# Patient Record
Sex: Female | Born: 1970 | Race: White | Hispanic: No | Smoking: Never smoker
Health system: Southern US, Community
[De-identification: ages and names within clinical notes are randomized; demographics above are authoritative.]

## PROBLEM LIST (undated history)

## (undated) DIAGNOSIS — E0500 Thyrotoxicosis with diffuse goiter without thyrotoxic crisis or storm: Secondary | ICD-10-CM

## (undated) DIAGNOSIS — R87612 Low grade squamous intraepithelial lesion on cytologic smear of cervix (LGSIL): Secondary | ICD-10-CM

## (undated) DIAGNOSIS — D649 Anemia, unspecified: Secondary | ICD-10-CM

## (undated) DIAGNOSIS — IMO0002 Reserved for concepts with insufficient information to code with codable children: Secondary | ICD-10-CM

## (undated) DIAGNOSIS — F419 Anxiety disorder, unspecified: Secondary | ICD-10-CM

## (undated) DIAGNOSIS — J45909 Unspecified asthma, uncomplicated: Secondary | ICD-10-CM

## (undated) DIAGNOSIS — R87810 Cervical high risk human papillomavirus (HPV) DNA test positive: Secondary | ICD-10-CM

## (undated) DIAGNOSIS — R51 Headache: Secondary | ICD-10-CM

## (undated) DIAGNOSIS — T7840XA Allergy, unspecified, initial encounter: Secondary | ICD-10-CM

## (undated) DIAGNOSIS — N900 Mild vulvar dysplasia: Secondary | ICD-10-CM

## (undated) DIAGNOSIS — R87610 Atypical squamous cells of undetermined significance on cytologic smear of cervix (ASC-US): Secondary | ICD-10-CM

## (undated) HISTORY — PX: WISDOM TOOTH EXTRACTION: SHX21

## (undated) HISTORY — DX: Atypical squamous cells of undetermined significance on cytologic smear of cervix (ASC-US): R87.610

## (undated) HISTORY — DX: Anemia, unspecified: D64.9

## (undated) HISTORY — DX: Reserved for concepts with insufficient information to code with codable children: IMO0002

## (undated) HISTORY — DX: Anxiety disorder, unspecified: F41.9

## (undated) HISTORY — DX: Allergy, unspecified, initial encounter: T78.40XA

## (undated) HISTORY — PX: MOUTH SURGERY: SHX715

## (undated) HISTORY — DX: Low grade squamous intraepithelial lesion on cytologic smear of cervix (LGSIL): R87.612

## (undated) HISTORY — DX: Thyrotoxicosis with diffuse goiter without thyrotoxic crisis or storm: E05.00

## (undated) HISTORY — DX: Mild vulvar dysplasia: N90.0

## (undated) HISTORY — DX: Unspecified asthma, uncomplicated: J45.909

## (undated) HISTORY — DX: Cervical high risk human papillomavirus (HPV) DNA test positive: R87.810

---

## 2003-07-23 ENCOUNTER — Emergency Department (HOSPITAL_COMMUNITY): Admission: EM | Admit: 2003-07-23 | Discharge: 2003-07-23 | Payer: Self-pay | Admitting: Emergency Medicine

## 2004-07-22 ENCOUNTER — Other Ambulatory Visit: Admission: RE | Admit: 2004-07-22 | Discharge: 2004-07-22 | Payer: Self-pay | Admitting: Gynecology

## 2004-08-16 DIAGNOSIS — N900 Mild vulvar dysplasia: Secondary | ICD-10-CM

## 2004-08-16 HISTORY — DX: Mild vulvar dysplasia: N90.0

## 2004-08-21 ENCOUNTER — Inpatient Hospital Stay (HOSPITAL_COMMUNITY): Admission: AD | Admit: 2004-08-21 | Discharge: 2004-08-22 | Payer: Self-pay | Admitting: Gynecology

## 2004-12-30 ENCOUNTER — Observation Stay (HOSPITAL_COMMUNITY): Admission: AD | Admit: 2004-12-30 | Discharge: 2004-12-31 | Payer: Self-pay | Admitting: Gynecology

## 2005-02-16 ENCOUNTER — Inpatient Hospital Stay (HOSPITAL_COMMUNITY): Admission: AD | Admit: 2005-02-16 | Discharge: 2005-02-18 | Payer: Self-pay | Admitting: Gynecology

## 2005-05-07 ENCOUNTER — Other Ambulatory Visit: Admission: RE | Admit: 2005-05-07 | Discharge: 2005-05-07 | Payer: Self-pay | Admitting: Gynecology

## 2006-06-08 ENCOUNTER — Other Ambulatory Visit: Admission: RE | Admit: 2006-06-08 | Discharge: 2006-06-08 | Payer: Self-pay | Admitting: Gynecology

## 2007-07-05 ENCOUNTER — Other Ambulatory Visit: Admission: RE | Admit: 2007-07-05 | Discharge: 2007-07-05 | Payer: Self-pay | Admitting: Gynecology

## 2008-07-08 ENCOUNTER — Ambulatory Visit: Payer: Self-pay | Admitting: Gynecology

## 2008-07-08 ENCOUNTER — Other Ambulatory Visit: Admission: RE | Admit: 2008-07-08 | Discharge: 2008-07-08 | Payer: Self-pay | Admitting: Gynecology

## 2008-07-08 ENCOUNTER — Encounter: Payer: Self-pay | Admitting: Gynecology

## 2009-07-16 ENCOUNTER — Ambulatory Visit: Payer: Self-pay | Admitting: Gynecology

## 2009-07-16 ENCOUNTER — Other Ambulatory Visit: Admission: RE | Admit: 2009-07-16 | Discharge: 2009-07-16 | Payer: Self-pay | Admitting: Gynecology

## 2009-09-23 ENCOUNTER — Emergency Department (HOSPITAL_COMMUNITY): Admission: EM | Admit: 2009-09-23 | Discharge: 2009-09-23 | Payer: Self-pay | Admitting: Emergency Medicine

## 2010-09-09 ENCOUNTER — Other Ambulatory Visit: Payer: Self-pay | Admitting: Gynecology

## 2010-09-09 ENCOUNTER — Other Ambulatory Visit (HOSPITAL_COMMUNITY)
Admission: RE | Admit: 2010-09-09 | Discharge: 2010-09-09 | Disposition: A | Discharge status: Home / Self Care | Payer: Commercial Indemnity | Source: Ambulatory Visit | Attending: Gynecology | Admitting: Gynecology

## 2010-09-09 ENCOUNTER — Ambulatory Visit
Admission: RE | Admit: 2010-09-09 | Discharge: 2010-09-09 | Payer: Self-pay | Source: Home / Self Care | Attending: Gynecology | Admitting: Gynecology

## 2010-09-09 DIAGNOSIS — Z124 Encounter for screening for malignant neoplasm of cervix: Secondary | ICD-10-CM | POA: Insufficient documentation

## 2010-09-14 ENCOUNTER — Other Ambulatory Visit: Payer: Self-pay | Admitting: Gynecology

## 2010-09-14 DIAGNOSIS — Z1239 Encounter for other screening for malignant neoplasm of breast: Secondary | ICD-10-CM

## 2010-10-07 ENCOUNTER — Ambulatory Visit
Admission: RE | Admit: 2010-10-07 | Discharge: 2010-10-07 | Disposition: A | Discharge status: Home / Self Care | Payer: Commercial Indemnity | Source: Ambulatory Visit | Attending: Gynecology | Admitting: Gynecology

## 2010-10-07 DIAGNOSIS — Z1239 Encounter for other screening for malignant neoplasm of breast: Secondary | ICD-10-CM

## 2010-10-12 ENCOUNTER — Other Ambulatory Visit: Payer: Self-pay | Admitting: Family Medicine

## 2010-10-13 ENCOUNTER — Ambulatory Visit
Admission: RE | Admit: 2010-10-13 | Discharge: 2010-10-13 | Disposition: A | Discharge status: Home / Self Care | Payer: Commercial Indemnity | Source: Ambulatory Visit | Attending: Family Medicine | Admitting: Family Medicine

## 2010-11-05 LAB — CBC
MCHC: 34.8 g/dL (ref 30.0–36.0)
RBC: 3.81 MIL/uL — ABNORMAL LOW (ref 3.87–5.11)
RDW: 13.5 % (ref 11.5–15.5)

## 2010-11-05 LAB — DIFFERENTIAL
Eosinophils Absolute: 0.2 10*3/uL (ref 0.0–0.7)
Eosinophils Relative: 3 % (ref 0–5)
Lymphs Abs: 2 10*3/uL (ref 0.7–4.0)
Monocytes Relative: 9 % (ref 3–12)

## 2010-11-05 LAB — URINALYSIS, ROUTINE W REFLEX MICROSCOPIC
Bilirubin Urine: NEGATIVE
Ketones, ur: NEGATIVE mg/dL
Nitrite: NEGATIVE
Specific Gravity, Urine: 1.011 (ref 1.005–1.030)
Urobilinogen, UA: 0.2 mg/dL (ref 0.0–1.0)

## 2010-11-05 LAB — COMPREHENSIVE METABOLIC PANEL
ALT: 12 U/L (ref 0–35)
AST: 13 U/L (ref 0–37)
Albumin: 3.8 g/dL (ref 3.5–5.2)
Calcium: 9.1 mg/dL (ref 8.4–10.5)
GFR calc Af Amer: >60 mL/min (ref >60)
Sodium: 141 mEq/L (ref 135–145)
Total Protein: 6.1 g/dL (ref 6.0–8.3)

## 2011-01-01 NOTE — Op Note (Signed)
Becky Aguilar, Becky Aguilar           ACCOUNT NO.:  [id number]   MEDICAL RECORD NO.:  [id number]          PATIENT TYPE:  INP   LOCATION:  9166                          FACILITY:  [facility]   PHYSICIAN:  Charles A. Delcambre, MDDATE OF BIRTH:  [birth year]   DATE OF PROCEDURE:  02/16/2005  DATE OF DISCHARGE:                                 OPERATIVE REPORT   DELIVERY NOTE:  This patient progressed on to complete dilation and became  complete at 2008.  She had spontaneous vaginal delivery at 2225 with  delivery of placenta at 2241.  She had vigorous female with Apgars 8 and 9.  Meconium was noted.  DeLee suction was done on the perineum.  Dr. Alison Murray was  in attendance but the baby vigorously cried upon delivery of the head.  After suctioning with vigorous bulb suction was carried out and cord was cut  by the father of the baby.  Infant was handed to Dr. Alison Murray but routine care  was undertaken thereafter.  A secondary midline laceration was noted and  repaired with 2-0 Vicryl, three sutures in the anterior capsule just to  strengthen.  The capsule was not felt to be lacerated significantly for anal  sphincter but was strengthened in this manner.  2-0 Vicryl and 3-0 chromic  was used in layers to repair the remaining secondary laceration with local  anesthetic and 15 mg of fentanyl  IV.  Estimated blood loss was 300 mL.  Nuchal cord was reduced x2 at delivery.  Placenta was spontaneous and three  vessels and intact as noted above.  Cord gas was returned PH venous 7.35.  Arterial gas was drawn but was not enough according to the lab.  Mother  and baby recovered and stable at this time.       CAD/MEDQ  D:  02/17/2005  T:  02/17/2005  Job:  [id number]

## 2011-02-16 ENCOUNTER — Other Ambulatory Visit: Payer: Self-pay | Admitting: Dermatology

## 2011-07-20 ENCOUNTER — Other Ambulatory Visit: Payer: Self-pay | Admitting: Dermatology

## 2011-09-20 ENCOUNTER — Encounter: Payer: Self-pay | Admitting: *Deleted

## 2011-09-20 DIAGNOSIS — N900 Mild vulvar dysplasia: Secondary | ICD-10-CM | POA: Insufficient documentation

## 2011-09-22 ENCOUNTER — Other Ambulatory Visit (HOSPITAL_COMMUNITY)
Admission: RE | Admit: 2011-09-22 | Discharge: 2011-09-22 | Disposition: A | Discharge status: Home / Self Care | Payer: Managed Care, Other (non HMO) | Source: Ambulatory Visit | Attending: Gynecology | Admitting: Gynecology

## 2011-09-22 ENCOUNTER — Ambulatory Visit (INDEPENDENT_AMBULATORY_CARE_PROVIDER_SITE_OTHER): Payer: Commercial Indemnity | Admitting: Gynecology

## 2011-09-22 ENCOUNTER — Encounter: Payer: Self-pay | Admitting: Gynecology

## 2011-09-22 VITALS — BP 110/60 | Ht 69.0 in | Wt 139.0 lb

## 2011-09-22 DIAGNOSIS — Z13220 Encounter for screening for lipoid disorders: Secondary | ICD-10-CM

## 2011-09-22 DIAGNOSIS — N814 Uterovaginal prolapse, unspecified: Secondary | ICD-10-CM

## 2011-09-22 DIAGNOSIS — Z131 Encounter for screening for diabetes mellitus: Secondary | ICD-10-CM

## 2011-09-22 DIAGNOSIS — Z30431 Encounter for routine checking of intrauterine contraceptive device: Secondary | ICD-10-CM

## 2011-09-22 DIAGNOSIS — Z01419 Encounter for gynecological examination (general) (routine) without abnormal findings: Secondary | ICD-10-CM | POA: Insufficient documentation

## 2011-09-22 DIAGNOSIS — N816 Rectocele: Secondary | ICD-10-CM

## 2011-09-22 LAB — URINALYSIS W MICROSCOPIC + REFLEX CULTURE
Bilirubin Urine: NEGATIVE
Nitrite: NEGATIVE
Specific Gravity, Urine: 1.02 (ref 1.005–1.030)
pH: 6 (ref 5.0–8.0)

## 2011-09-22 NOTE — Patient Instructions (Signed)
Start prenatal vitamins. Wait one to 2 months before trying for pregnancy. Follow up with obstetrician once pregnancy occurs.

## 2011-09-22 NOTE — Progress Notes (Signed)
Becky Aguilar 04-Sep-1970 [id number]        41 y.o.  for annual exam.  Several issues as noted below.  Past medical history,surgical history, medications, allergies, family history and social history were all reviewed and documented in the EPIC chart. ROS:  Was performed and pertinent positives and negatives are included in the history.  Exam: [name] chaperone present Filed Vitals:   09/22/11 1021  BP: 110/60   General appearance  Normal Skin grossly normal Head/Neck normal with no cervical or supraclavicular adenopathy thyroid normal Lungs  clear Cardiac RR, without RMG Abdominal  soft, nontender, without masses, organomegaly or hernia Breasts  examined lying and sitting without masses, retractions, discharge or axillary adenopathy. Pelvic  Ext/BUS/vagina  normal   Cervix  normal  Pap done IUD string visualized  Uterus  retroverted, normal size, shape and contour, midline and mobile nontender with first degree prolapse  Adnexa  Without masses or tenderness    Anus and perineum  normal   Rectovaginal  normal sphincter tone without palpated masses or tenderness. With first degree to second degree rectocele   Assessment/Plan:  41 y.o. female for annual exam.    1. IUD management. Patient's planning pregnancy and wants her IUD removed. Her ParaGard IUD string was grasped with a [place] forceps and removed, shown to the patient and discarded. 2. Pregnancy trial. Recommended patient wait 1-2 cycles before attempting pregnancy to allow for healthy endometrial growth. Recommended prenatal vitamins preconceptionally. Patient understands that we do not do obstetrics and that when she does become pregnant she will need to establish obstetrical care with another OB group in town and she agrees with this. 3. Pelvic relaxation. Patient has complaints of pelvic heaviness and feeling like something is pushing out her vagina particularly when she's constipated. Does not happen all the time. She does  have evidence of a first-degree uterine prolapse with some total scoping of this curve cervix to within 2-3 fingerbreadths of the introitus. She also has a first to second-degree rectocele with exam. Bladder appears well supported. I reviewed the issues with this and certainly would not recommend doing anything until she is done with her pregnancy and hopefully all childbearing before doing anything. Options for pessary, surgical repair such as suspension anterior-posterior colporrhaphy up to and including hysterectomy were all reviewed. Patient will monitor present. 4. Mammography. Patient had her baseline last February Will repeat at age 36. SBE monthly reviewed. 5. Pap smear. Pap smear was done today. 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 6. Health maintenance. Baseline CBC glucose lipid profile urinalysis was ordered. Patient will follow up when she achieves pregnancy or in one year.    Dara Lords MD, 10:59 AM 09/22/2011

## 2011-09-22 NOTE — Progress Notes (Signed)
Addended by: Dayna Barker on: 09/22/2011 11:09 AM   Modules accepted: Orders

## 2011-09-27 NOTE — Progress Notes (Signed)
Attempted to draw patient twice; patient felt sick and said she would come back another day for blood work. Urine sample done only. ncs 09/27/11

## 2011-09-30 ENCOUNTER — Telehealth: Payer: Self-pay | Admitting: *Deleted

## 2011-09-30 NOTE — Telephone Encounter (Signed)
Pt is calling to follow up from office visit on 09/22/11, she is wanting to proceed with surgery that was dicussed.

## 2011-09-30 NOTE — Telephone Encounter (Signed)
Yes, pt spoke with her family about it and they decided to proceed.

## 2011-09-30 NOTE — Telephone Encounter (Signed)
Is she talking about hysterectomy with the vaginal repair ?

## 2011-09-30 NOTE — Telephone Encounter (Signed)
I spoke with the patient just to reconfirm the she's not having any urinary symptoms and she is not having any stress incontinence type symptoms. We'll plan on a TVH with posterior colporrhaphy. Patient will schedule and present for a preoperative consult.

## 2011-10-01 ENCOUNTER — Telehealth: Payer: Self-pay

## 2011-10-01 NOTE — Telephone Encounter (Signed)
Spoke with patient regarding scheduling surgery. She would like to schedule ASAP as some concerns regarding her job/ins.  I scheduled her for March 4 at 1:00pm at [facility].  We scheduled her preop consult with Dr. Velvet Bathe for 10/06/11 at 3pm as she is off work that day.  She will call if any questions otherwise will wait to hear from [facility] with all her instructions.

## 2011-10-04 ENCOUNTER — Encounter (HOSPITAL_COMMUNITY): Payer: Self-pay | Admitting: Pharmacist

## 2011-10-06 ENCOUNTER — Encounter: Payer: Self-pay | Admitting: Gynecology

## 2011-10-06 ENCOUNTER — Ambulatory Visit (INDEPENDENT_AMBULATORY_CARE_PROVIDER_SITE_OTHER): Payer: Commercial Indemnity | Admitting: Gynecology

## 2011-10-06 DIAGNOSIS — N814 Uterovaginal prolapse, unspecified: Secondary | ICD-10-CM

## 2011-10-06 DIAGNOSIS — N816 Rectocele: Secondary | ICD-10-CM

## 2011-10-06 NOTE — H&P (Signed)
Becky Aguilar 1971/03/16 [id number]   History and Physical    Chief complaint: uterine prolapse/rectocele  History of present illness: 41 y.o.  G1 P90 female with complaints of increasing pelvic heaviness feeling like something is protruding from the vagina. Exam reveals first to second-degree uterine prolapse and first to second degree rectocele she is to be admitted for planned vaginal hysterectomy, posterior colporrhaphy.   Past medical history,surgical history, medications, allergies, family history and social history were all reviewed and documented in the EPIC chart. ROS:  Was performed and pertinent positives and negatives are included in the history of present illness.  Exam: General: well developed, well nourished female, no acute distress HEENT: normal  Lungs: clear to auscultation without wheezing, rales or rhonchi  Cardiac: regular rate without rubs, murmurs or gallops  Abdomen: soft, nontender without masses, guarding, rebound, organomegaly  Pelvic: external bus vagina: normal   Cervix: grossly normal  Uterus: normal size, midline and mobile, nontender descends to within 1-2 fingers  from the introitus. Adnexa: without masses or tenderness  Rectovaginal exam within first to second degree rectocele     Assessment/Plan:  41 year old G1 P72 female with symptomatic uterine prolapse and rectocele.  Options for management have been reviewed with her to include expected management, pessary use as well as hysterectomy with posterior repair. Patient has decided to proceed with hysterectomy and posterior repair. She has good anterior support and no history of urinary incontinence. I reviewed with her the anatomy and issues with prolapse she understands she is at increased risk for cystocele and vaginal prolapse in the future and possible need for future surgery and she accepts this. The absolute and irreversible sterility of hysterectomy was reviewed with her. Particularly since the  previous outpatient visit was for removal of her IUD for consideration for future pregnancy. I reviewed this with her and she stated that she went home and thought seriously about whether she wants to proceed with pregnancy or not and has decided against any future pregnancies and wants to proceed with hysterectomy clearly understanding it is absolute irreversible sterility. Sexuality following hysterectomy was also reviewed and the possibility of persistent orgasmic dysfunction or persistent dyspareunia was reviewed with her. The issues of a posterior repair and vaginal tightening was reviewed and she understands that the vagina may require some dilatation following the surgery and accepts this possibility. The ovarian conservation issue was reviewed with her and the options of keeping both ovaries or removing both ovaries was discussed. The issues of continued hormone production versus removal of estrogen production and the risks including symptoms, accelerated cardiovascular risk accelerated osteoporosis were reviewed and the patient wants to keep both ovaries if possible. By doing so she understands there is risk of ovarian disease and ovarian cancer in the future and accepts this possibility. She also gives me permission though to remove one or both ovaries if it is my best judgment to do so during the procedure.  The expected intraoperative postoperative courses were reviewed with her and the risks of infection requiring prolonged antibiotics, abscess hematoma formation requiring reoperation and drainage was reviewed with her. She understands that we are attempting a vaginal approach but at any time during the surgery if I feels unsafe to proceed or complications arise we will convert this to an abdominal approach which would mean an abdominal incision and longer recovery period. Incisional complications to include opening and draining of incisions, closure by secondary intention, cosmetic such as keloid  formation and long-term issues with hernia formation  were reviewed. The risk of hemorrhage necessitating transfusion and the risks of transfusion to include transfusion reaction, hepatitis, HIV, mad cow disease and other unknown entities was reviewed with her. The risk of inadvertent injury to internal organs including bowel, bladder, ureters, vessels and nerves, either immediately recognized or delay recognized, necessitating major exploratory reparative surgeries and future reparative surgeries including bowel resection, bladder repair, ureteral damage repair and ostomy formation was all discussed understood and accepted. The patient's questions were answered to her satisfaction and she is ready to proceed with surgery.   Dara Lords MD, 4:42 PM 10/06/2011

## 2011-10-06 NOTE — Patient Instructions (Signed)
Follow for surgery as scheduled. Call sooner if any questions.

## 2011-10-06 NOTE — Progress Notes (Signed)
Becky Aguilar 1970/12/03 [id number]   Preoperative consult    Chief complaint: uterine prolapse/rectocele  History of present illness: 41 y.o.  G1 P68 female with complaints of increasing pelvic heaviness feeling like something is protruding from the vagina. Exam reveals first to second-degree uterine prolapse and first to second degree rectocele she is to be admitted for planned vaginal hysterectomy, posterior colporrhaphy.   Past medical history,surgical history, medications, allergies, family history and social history were all reviewed and documented in the EPIC chart. ROS:  Was performed and pertinent positives and negatives are included in the history of present illness.  Exam: General: well developed, well nourished female, no acute distress HEENT: normal  Lungs: clear to auscultation without wheezing, rales or rhonchi  Cardiac: regular rate without rubs, murmurs or gallops  Abdomen: soft, nontender without masses, guarding, rebound, organomegaly  Pelvic: external bus vagina: normal   Cervix: grossly normal  Uterus: normal size, midline and mobile, nontender descends to within 1-2 fingers  from the introitus. Adnexa: without masses or tenderness  Rectovaginal exam within first to second degree rectocele     Assessment/Plan:  41 year old G1 P47 female with symptomatic uterine prolapse and rectocele.  Options for management have been reviewed with her to include expected management, pessary use as well as hysterectomy with posterior repair. Patient has decided to proceed with hysterectomy and posterior repair. She has good anterior support and no history of urinary incontinence. I reviewed with her the anatomy and issues with prolapse she understands she is at increased risk for cystocele and vaginal prolapse in the future and possible need for future surgery and she accepts this. The absolute and irreversible sterility of hysterectomy was reviewed with her. Particularly since the  previous outpatient visit was for removal of her IUD for consideration for future pregnancy. I reviewed this with her and she stated that she went home and thought seriously about whether she wants to proceed with pregnancy or not and has decided against any future pregnancies and wants to proceed with hysterectomy clearly understanding it is absolute irreversible sterility. Sexuality following hysterectomy was also reviewed and the possibility of persistent orgasmic dysfunction or persistent dyspareunia was reviewed with her. The issues of a posterior repair and vaginal tightening was reviewed and she understands that the vagina may require some dilatation following the surgery and accepts this possibility. The ovarian conservation issue was reviewed with her and the options of keeping both ovaries or removing both ovaries was discussed. The issues of continued hormone production versus removal of estrogen production and the risks including symptoms, accelerated cardiovascular risk accelerated osteoporosis were reviewed and the patient wants to keep both ovaries if possible. By doing so she understands there is risk of ovarian disease and ovarian cancer in the future and accepts this possibility. She also gives me permission though to remove one or both ovaries if it is my best judgment to do so during the procedure.  The expected intraoperative postoperative courses were reviewed with her and the risks of infection requiring prolonged antibiotics, abscess hematoma formation requiring reoperation and drainage was reviewed with her. She understands that we are attempting a vaginal approach but at any time during the surgery if I feels unsafe to proceed or complications arise we will convert this to an abdominal approach which would mean an abdominal incision and longer recovery period. Incisional complications to include opening and draining of incisions, closure by secondary intention, cosmetic such as keloid  formation and long-term issues with hernia formation were  reviewed. The risk of hemorrhage necessitating transfusion and the risks of transfusion to include transfusion reaction, hepatitis, HIV, mad cow disease and other unknown entities was reviewed with her. The risk of inadvertent injury to internal organs including bowel, bladder, ureters, vessels and nerves, either immediately recognized or delay recognized, necessitating major exploratory reparative surgeries and future reparative surgeries including bowel resection, bladder repair, ureteral damage repair and ostomy formation was all discussed understood and accepted. The patient's questions were answered to her satisfaction and she is ready to proceed with surgery.    Dara Lords MD, 4:27 PM 10/06/2011

## 2011-10-13 ENCOUNTER — Telehealth: Payer: Self-pay | Admitting: *Deleted

## 2011-10-13 NOTE — Telephone Encounter (Signed)
Pt is scheduled for TVH with posterior colporrhaphy 10/18/11 she has noticed some lower back pain. Pt said it feels like someone is pulling her muscles in her back. Pt is taking OTC tylenol it doesn't seem to help much. Pt said that she didn't know if this was normal because of uterine prolapse? Pt saw online that this could be normal? But she did want to check with you and let you know this prior to surgery. Please advise

## 2011-10-13 NOTE — Telephone Encounter (Signed)
Not unusual. I would try Motrin 800 mg 3 times a day when necessary.

## 2011-10-13 NOTE — Telephone Encounter (Signed)
Spoke with pt about the below and she said okay. She will take OTC medication.

## 2011-10-15 ENCOUNTER — Encounter (HOSPITAL_COMMUNITY): Payer: Self-pay

## 2011-10-15 ENCOUNTER — Encounter (HOSPITAL_COMMUNITY)
Admission: RE | Admit: 2011-10-15 | Discharge: 2011-10-15 | Disposition: A | Discharge status: Home / Self Care | Payer: Managed Care, Other (non HMO) | Source: Ambulatory Visit | Attending: Gynecology | Admitting: Gynecology

## 2011-10-15 HISTORY — DX: Headache: R51

## 2011-10-15 LAB — CBC
MCH: 31.2 pg (ref 26.0–34.0)
MCHC: 33.2 g/dL (ref 30.0–36.0)
Platelets: 253 10*3/uL (ref 150–400)
RDW: 13 % (ref 11.5–15.5)

## 2011-10-15 LAB — SURGICAL PCR SCREEN: MRSA, PCR: NEGATIVE

## 2011-10-15 NOTE — Patient Instructions (Addendum)
   Your procedure is scheduled on: Monday March 4th  Enter through the Main Entrance of [facility] at: 11:30am Pick up the phone at the desk and dial [phone] and inform us of your arrival.  Please call this number if you have any problems the morning of surgery: [phone]  Remember: Do not eat food after midnight: Sunday Do not drink clear liquids after: 9am Monday Take these medicines the morning of surgery with a SIP OF WATER:none  Do not wear jewelry, make-up, or FINGER nail polish Do not wear lotions, powders, perfumes or deodorant. Do not shave 48 hours prior to surgery. Do not bring valuables to the hospital. Contacts, dentures or bridgework may not be worn into surgery.  Leave suitcase in the car. After Surgery it may be brought to your room. For patients being admitted to the hospital, checkout time is 11:00am the day of discharge.    Remember to use your hibiclens as instructed.Please shower with 1/2 bottle the evening before your surgery and the other 1/2 bottle the morning of surgery. Neck down avoiding private area.

## 2011-10-17 MED ORDER — DEXTROSE 5 % IV SOLN
1.0000 g | INTRAVENOUS | Status: AC
  Administered 2011-10-18: 1 g via INTRAVENOUS
  Filled 2011-10-17: qty 1

## 2011-10-18 ENCOUNTER — Encounter (HOSPITAL_COMMUNITY): Payer: Self-pay | Admitting: *Deleted

## 2011-10-18 ENCOUNTER — Encounter (HOSPITAL_COMMUNITY)
Admission: RE | Disposition: A | Discharge status: Home / Self Care | Payer: Self-pay | Source: Ambulatory Visit | Attending: Gynecology

## 2011-10-18 ENCOUNTER — Ambulatory Visit (HOSPITAL_COMMUNITY): Payer: Managed Care, Other (non HMO) | Admitting: Anesthesiology

## 2011-10-18 ENCOUNTER — Ambulatory Visit (HOSPITAL_COMMUNITY)
Admission: RE | Admit: 2011-10-18 | Discharge: 2011-10-19 | Disposition: A | Discharge status: Home / Self Care | Payer: Managed Care, Other (non HMO) | Source: Ambulatory Visit | Attending: Gynecology | Admitting: Gynecology

## 2011-10-18 ENCOUNTER — Encounter (HOSPITAL_COMMUNITY): Payer: Self-pay | Admitting: Anesthesiology

## 2011-10-18 DIAGNOSIS — Z01812 Encounter for preprocedural laboratory examination: Secondary | ICD-10-CM | POA: Insufficient documentation

## 2011-10-18 DIAGNOSIS — N816 Rectocele: Secondary | ICD-10-CM

## 2011-10-18 DIAGNOSIS — N812 Incomplete uterovaginal prolapse: Secondary | ICD-10-CM | POA: Insufficient documentation

## 2011-10-18 DIAGNOSIS — Z9889 Other specified postprocedural states: Secondary | ICD-10-CM

## 2011-10-18 DIAGNOSIS — N814 Uterovaginal prolapse, unspecified: Secondary | ICD-10-CM

## 2011-10-18 DIAGNOSIS — Z01818 Encounter for other preprocedural examination: Secondary | ICD-10-CM | POA: Insufficient documentation

## 2011-10-18 DIAGNOSIS — N841 Polyp of cervix uteri: Secondary | ICD-10-CM | POA: Insufficient documentation

## 2011-10-18 HISTORY — PX: VAGINAL HYSTERECTOMY: SHX2639

## 2011-10-18 HISTORY — PX: RECTOCELE REPAIR: SHX761

## 2011-10-18 LAB — HCG, SERUM, QUALITATIVE: Preg, Serum: NEGATIVE

## 2011-10-18 SURGERY — HYSTERECTOMY, VAGINAL
Anesthesia: General

## 2011-10-18 MED ORDER — HYDROMORPHONE HCL PF 1 MG/ML IJ SOLN
INTRAMUSCULAR | Status: AC
  Administered 2011-10-18: 0.5 mg via INTRAVENOUS
  Filled 2011-10-18: qty 1

## 2011-10-18 MED ORDER — GLYCOPYRROLATE 0.2 MG/ML IJ SOLN
INTRAMUSCULAR | Status: DC | PRN
  Administered 2011-10-18: .6 mg via INTRAVENOUS

## 2011-10-18 MED ORDER — NEOSTIGMINE METHYLSULFATE 1 MG/ML IJ SOLN
INTRAMUSCULAR | Status: DC | PRN
  Administered 2011-10-18: 3 mg via INTRAVENOUS

## 2011-10-18 MED ORDER — DEXTROSE-NACL 5-0.9 % IV SOLN: INTRAVENOUS | Status: DC

## 2011-10-18 MED ORDER — KETOROLAC TROMETHAMINE 30 MG/ML IJ SOLN
30.0000 mg | Freq: Four times a day (QID) | INTRAMUSCULAR | Status: DC
  Administered 2011-10-18 – 2011-10-19 (×3): 30 mg via INTRAVENOUS
  Filled 2011-10-18 (×3): qty 1

## 2011-10-18 MED ORDER — OXYCODONE-ACETAMINOPHEN 5-325 MG PO TABS
1.0000 | ORAL_TABLET | ORAL | Status: DC | PRN
  Administered 2011-10-19: 2 via ORAL
  Filled 2011-10-18: qty 2

## 2011-10-18 MED ORDER — KETOROLAC TROMETHAMINE 30 MG/ML IJ SOLN: 30.0000 mg | Freq: Four times a day (QID) | INTRAMUSCULAR | Status: DC

## 2011-10-18 MED ORDER — ZOLPIDEM TARTRATE 5 MG PO TABS: 5.0000 mg | ORAL_TABLET | Freq: Every evening | ORAL | Status: DC | PRN

## 2011-10-18 MED ORDER — LACTATED RINGERS IV SOLN
INTRAVENOUS | Status: DC
  Administered 2011-10-18 (×2): 50 mL/h via INTRAVENOUS
  Administered 2011-10-18: 13:00:00 via INTRAVENOUS

## 2011-10-18 MED ORDER — HYDROMORPHONE HCL PF 1 MG/ML IJ SOLN
0.2500 mg | INTRAMUSCULAR | Status: DC | PRN
  Administered 2011-10-18 (×4): 0.5 mg via INTRAVENOUS

## 2011-10-18 MED ORDER — ROCURONIUM BROMIDE 100 MG/10ML IV SOLN
INTRAVENOUS | Status: DC | PRN
  Administered 2011-10-18: 10 mg via INTRAVENOUS
  Administered 2011-10-18: 40 mg via INTRAVENOUS

## 2011-10-18 MED ORDER — ONDANSETRON HCL 4 MG/2ML IJ SOLN: 4.0000 mg | Freq: Four times a day (QID) | INTRAMUSCULAR | Status: DC | PRN

## 2011-10-18 MED ORDER — FENTANYL CITRATE 0.05 MG/ML IJ SOLN
INTRAMUSCULAR | Status: DC | PRN
  Administered 2011-10-18 (×2): 50 ug via INTRAVENOUS
  Administered 2011-10-18: 100 ug via INTRAVENOUS
  Administered 2011-10-18: 50 ug via INTRAVENOUS
  Administered 2011-10-18: 100 ug via INTRAVENOUS

## 2011-10-18 MED ORDER — SCOPOLAMINE 1 MG/3DAYS TD PT72
MEDICATED_PATCH | TRANSDERMAL | Status: AC
  Filled 2011-10-18: qty 1

## 2011-10-18 MED ORDER — KETOROLAC TROMETHAMINE 30 MG/ML IJ SOLN
INTRAMUSCULAR | Status: DC | PRN
  Administered 2011-10-18: 30 mg via INTRAVENOUS

## 2011-10-18 MED ORDER — MIDAZOLAM HCL 2 MG/2ML IJ SOLN
INTRAMUSCULAR | Status: AC
  Filled 2011-10-18: qty 2

## 2011-10-18 MED ORDER — BUTALBITAL-APAP-CAFFEINE 50-325-40 MG PO TABS
2.0000 | ORAL_TABLET | Freq: Once | ORAL | Status: AC
  Administered 2011-10-18: 2 via ORAL

## 2011-10-18 MED ORDER — GLYCOPYRROLATE 0.2 MG/ML IJ SOLN
INTRAMUSCULAR | Status: AC
  Filled 2011-10-18: qty 2

## 2011-10-18 MED ORDER — ROCURONIUM BROMIDE 50 MG/5ML IV SOLN
INTRAVENOUS | Status: AC
  Filled 2011-10-18: qty 1

## 2011-10-18 MED ORDER — SIMETHICONE 80 MG PO CHEW: 80.0000 mg | CHEWABLE_TABLET | Freq: Four times a day (QID) | ORAL | Status: DC | PRN

## 2011-10-18 MED ORDER — MENTHOL 3 MG MT LOZG: 1.0000 | LOZENGE | OROMUCOSAL | Status: DC | PRN

## 2011-10-18 MED ORDER — SCOPOLAMINE 1 MG/3DAYS TD PT72
1.0000 | MEDICATED_PATCH | TRANSDERMAL | Status: DC
  Administered 2011-10-18: 1.5 mg via TRANSDERMAL

## 2011-10-18 MED ORDER — ONDANSETRON HCL 4 MG/2ML IJ SOLN
INTRAMUSCULAR | Status: AC
  Filled 2011-10-18: qty 2

## 2011-10-18 MED ORDER — LIDOCAINE-EPINEPHRINE 1 %-1:100000 IJ SOLN
INTRAMUSCULAR | Status: DC | PRN
  Administered 2011-10-18: 10 mL

## 2011-10-18 MED ORDER — MORPHINE SULFATE 4 MG/ML IJ SOLN
1.0000 mg | INTRAMUSCULAR | Status: DC | PRN
  Administered 2011-10-18 – 2011-10-19 (×4): 2 mg via INTRAVENOUS
  Filled 2011-10-18 (×4): qty 1

## 2011-10-18 MED ORDER — DEXAMETHASONE SODIUM PHOSPHATE 4 MG/ML IJ SOLN
INTRAMUSCULAR | Status: DC | PRN
  Administered 2011-10-18: 10 mg via INTRAVENOUS

## 2011-10-18 MED ORDER — ESTRADIOL 0.1 MG/GM VA CREA
TOPICAL_CREAM | VAGINAL | Status: DC | PRN
  Administered 2011-10-18: 1 via VAGINAL

## 2011-10-18 MED ORDER — BUTALBITAL-APAP-CAFFEINE 50-325-40 MG PO TABS
ORAL_TABLET | ORAL | Status: AC
  Administered 2011-10-18: 2 via ORAL
  Filled 2011-10-18: qty 2

## 2011-10-18 MED ORDER — NEOSTIGMINE METHYLSULFATE 1 MG/ML IJ SOLN
INTRAMUSCULAR | Status: AC
  Filled 2011-10-18: qty 10

## 2011-10-18 MED ORDER — PROPOFOL 10 MG/ML IV EMUL
INTRAVENOUS | Status: DC | PRN
  Administered 2011-10-18: 170 mg via INTRAVENOUS

## 2011-10-18 MED ORDER — ONDANSETRON HCL 4 MG/2ML IJ SOLN
INTRAMUSCULAR | Status: DC | PRN
  Administered 2011-10-18: 4 mg via INTRAVENOUS

## 2011-10-18 MED ORDER — PROPOFOL 10 MG/ML IV EMUL
INTRAVENOUS | Status: AC
  Filled 2011-10-18: qty 20

## 2011-10-18 MED ORDER — ONDANSETRON HCL 4 MG PO TABS: 4.0000 mg | ORAL_TABLET | Freq: Four times a day (QID) | ORAL | Status: DC | PRN

## 2011-10-18 MED ORDER — KETOROLAC TROMETHAMINE 30 MG/ML IJ SOLN: 15.0000 mg | Freq: Once | INTRAMUSCULAR | Status: DC | PRN

## 2011-10-18 MED ORDER — FENTANYL CITRATE 0.05 MG/ML IJ SOLN
INTRAMUSCULAR | Status: AC
  Filled 2011-10-18: qty 5

## 2011-10-18 MED ORDER — DEXAMETHASONE SODIUM PHOSPHATE 10 MG/ML IJ SOLN
INTRAMUSCULAR | Status: AC
  Filled 2011-10-18: qty 1

## 2011-10-18 MED ORDER — FENTANYL CITRATE 0.05 MG/ML IJ SOLN
INTRAMUSCULAR | Status: AC
  Filled 2011-10-18: qty 2

## 2011-10-18 MED ORDER — LIDOCAINE HCL (CARDIAC) 20 MG/ML IV SOLN
INTRAVENOUS | Status: DC | PRN
  Administered 2011-10-18: 80 mg via INTRAVENOUS

## 2011-10-18 MED ORDER — MIDAZOLAM HCL 5 MG/5ML IJ SOLN
INTRAMUSCULAR | Status: DC | PRN
  Administered 2011-10-18: 2 mg via INTRAVENOUS

## 2011-10-18 MED ORDER — LIDOCAINE HCL (CARDIAC) 20 MG/ML IV SOLN
INTRAVENOUS | Status: AC
  Filled 2011-10-18: qty 5

## 2011-10-18 MED ORDER — DIPHENHYDRAMINE HCL 25 MG PO CAPS
50.0000 mg | ORAL_CAPSULE | Freq: Four times a day (QID) | ORAL | Status: DC | PRN
  Filled 2011-10-18: qty 1

## 2011-10-18 MED ORDER — ESTRADIOL 0.1 MG/GM VA CREA
TOPICAL_CREAM | VAGINAL | Status: AC
  Filled 2011-10-18: qty 42.5

## 2011-10-18 SURGICAL SUPPLY — 35 items
BLADE SURG 15 STRL LF C SS BP (BLADE) IMPLANT
BLADE SURG 15 STRL SS (BLADE) ×3
CANISTER SUCTION 2500CC (MISCELLANEOUS) ×3 IMPLANT
CLOTH BEACON ORANGE TIMEOUT ST (SAFETY) ×3 IMPLANT
CONT PATH 16OZ SNAP LID 3702 (MISCELLANEOUS) IMPLANT
CONTAINER PREFILL 10% NBF 60ML (FORM) IMPLANT
DECANTER SPIKE VIAL GLASS SM (MISCELLANEOUS) IMPLANT
ELECT LIGASURE SHORT 9 REUSE (ELECTRODE) IMPLANT
GAUZE PACKING 1/2 X5 YD (GAUZE/BANDAGES/DRESSINGS) ×1 IMPLANT
GAUZE PACKING 2X5 YD STERILE (GAUZE/BANDAGES/DRESSINGS) IMPLANT
GLOVE BIO SURGEON STRL SZ7.5 (GLOVE) ×6 IMPLANT
GLOVE BIOGEL PI IND STRL 6.5 (GLOVE) ×2 IMPLANT
GLOVE BIOGEL PI INDICATOR 6.5 (GLOVE) ×1
GOWN PREVENTION PLUS LG XLONG (DISPOSABLE) ×12 IMPLANT
GOWN STRL REIN XL XLG (GOWN DISPOSABLE) ×4 IMPLANT
NDL SPNL 18GX3.5 QUINCKE PK (NEEDLE) ×2 IMPLANT
NDL SPNL 22GX3.5 QUINCKE BK (NEEDLE) IMPLANT
NEEDLE HYPO 22GX1.5 SAFETY (NEEDLE) IMPLANT
NEEDLE MAYO .5 CIRCLE (NEEDLE) IMPLANT
NEEDLE SPNL 18GX3.5 QUINCKE PK (NEEDLE) ×3 IMPLANT
NEEDLE SPNL 22GX3.5 QUINCKE BK (NEEDLE) IMPLANT
NS IRRIG 1000ML POUR BTL (IV SOLUTION) ×3 IMPLANT
PACK VAGINAL WOMENS (CUSTOM PROCEDURE TRAY) ×3 IMPLANT
SUT VIC AB 0 CT1 18XCR BRD8 (SUTURE) ×6 IMPLANT
SUT VIC AB 0 CT1 27 (SUTURE) ×3
SUT VIC AB 0 CT1 27XBRD ANBCTR (SUTURE) ×2 IMPLANT
SUT VIC AB 0 CT1 8-18 (SUTURE) ×9
SUT VIC AB 2-0 SH 27 (SUTURE) ×18
SUT VIC AB 2-0 SH 27XBRD (SUTURE) IMPLANT
SUT VICRYL 0 TIES 12 18 (SUTURE) ×3 IMPLANT
SUT VICRYL 3 0 BR 18  UND (SUTURE)
SUT VICRYL 3 0 BR 18 UND (SUTURE) IMPLANT
TOWEL OR 17X24 6PK STRL BLUE (TOWEL DISPOSABLE) ×6 IMPLANT
TRAY FOLEY CATH 14FR (SET/KITS/TRAYS/PACK) ×3 IMPLANT
WATER STERILE IRR 1000ML POUR (IV SOLUTION) ×3 IMPLANT

## 2011-10-18 NOTE — Progress Notes (Signed)
I did not realize I was charting under Margarita Mail RN log in.  The assessment was performed and entered by Rockwell Alexandria RN

## 2011-10-18 NOTE — Transfer of Care (Signed)
Immediate Anesthesia Transfer of Care Note  Patient: Becky Aguilar  Procedure(s) Performed: Procedure(s) (LRB): HYSTERECTOMY VAGINAL (N/A) POSTERIOR REPAIR (RECTOCELE) ()  Patient Location: PACU  Anesthesia Type: General  Level of Consciousness: alert  and oriented  Airway & Oxygen Therapy: Patient Spontanous Breathing and Patient connected to nasal cannula oxygen  Post-op Assessment: Report given to PACU RN and Post -op Vital signs reviewed and stable  Post vital signs: stable  Complications: No apparent anesthesia complications

## 2011-10-18 NOTE — Anesthesia Postprocedure Evaluation (Signed)
Anesthesia Post Note  Patient: Becky Aguilar  Procedure(s) Performed: Procedure(s) (LRB): HYSTERECTOMY VAGINAL (N/A) POSTERIOR REPAIR (RECTOCELE) ()  Anesthesia type: General  Patient location: PACU  Post pain: Pain level controlled  Post assessment: Post-op Vital signs reviewed  Last Vitals:  Filed Vitals:   10/18/11 1545  BP: 87/47  Pulse: 64  Temp:   Resp: 16    Post vital signs: Reviewed  Level of consciousness: sedated  Complications: No apparent anesthesia complications

## 2011-10-18 NOTE — Anesthesia Preprocedure Evaluation (Signed)
Anesthesia Evaluation  Patient identified by MRN, date of birth, ID band Patient awake    Reviewed: Allergy & Precautions, H&P , NPO status , Patient's Chart, lab work & pertinent test results, reviewed documented beta blocker date and time   History of Anesthesia Complications Negative for: history of anesthetic complications  Airway Mallampati: II      Dental  (+) Teeth Intact   Pulmonary neg pulmonary ROS,  breath sounds clear to auscultation  Pulmonary exam normal       Cardiovascular Exercise Tolerance: Good negative cardio ROS  Rhythm:regular Rate:Normal     Neuro/Psych  Headaches (migraines 2-3 x/week.  getting one now), negative psych ROS   GI/Hepatic Neg liver ROS, GERD- (prn zantac)  ,  Endo/Other  negative endocrine ROS  Renal/GU negative Renal ROS  Female GU complaint     Musculoskeletal   Abdominal   Peds  Hematology negative hematology ROS (+)   Anesthesia Other Findings   Reproductive/Obstetrics negative OB ROS                           Anesthesia Physical Anesthesia Plan  ASA: II  Anesthesia Plan: General ETT   Post-op Pain Management:    Induction:   Airway Management Planned:   Additional Equipment:   Intra-op Plan:   Post-operative Plan:   Informed Consent: I have reviewed the patients History and Physical, chart, labs and discussed the procedure including the risks, benefits and alternatives for the proposed anesthesia with the patient or authorized representative who has indicated his/her understanding and acceptance.   Dental Advisory Given  Plan Discussed with: CRNA and Surgeon  Anesthesia Plan Comments:         Anesthesia Quick Evaluation

## 2011-10-18 NOTE — Op Note (Signed)
Becky Aguilar [birth year] [id number]   Post Operative Note   Date of surgery:  10/18/2011  Pre Op Dx:  #1 Uterine prolapse   #2 Rectocele  Post Op Dx:  same  Procedure:   #1  Total vaginal hysterectomy   #2   Posterior colporrhaphy  Surgeon:  Dara Lords  Assistant:   Reynaldo Minium  Anesthesia:  General  EBL:  Anesthesia reported 200 cc  Intraoperative urine output:  100 cc clear yellow  Complications:  None  Specimen:  Uterus to pathology  Findings: EUA:  External BUS vagina normal. Cervix normal. Uterus normal size midline mobile. Adnexa without masses.   Operative:  Cervix and uterus grossly normal. Right and left ovaries and fallopian tubes segments grossly normal. Cul-de-sac to limited inspection normal.  Procedure:  Patient was taken to the operating room, underwent general anesthesia without difficulty, placed in the dorsal lithotomy position, received a perineal/vaginal preparation with Betadine solution, indwelling Foley catheterization placed in sterile technique and an EUA performed with findings noted above. Patient was draped in the usual fashion and a weighted speculum was placed, the cervix grasped with a single-tooth tenaculum and the cervical mucosa was circumferentially injected using 1% lidocaine with 1/100,000 epinephrine mixture, a total of 10 cc. The cervical mucosa was then circumferentially incised and the paracervical planes sharply developed. The posterior cul-de-sac was then sharply entered without difficulty and a long weighted speculum was placed. The anterior cul-de-sac was then sharply developed and entered without difficulty and the right and left uterosacral ligaments were identified clamped cut and ligated using 0 Vicryl suture and tagged for future reference. The uterus was then progressively freed from its attachments by clamping, cutting and ligating of the cardinal ligaments and parametrial tissues. The uterus was then delivered through  the vagina and the uterine ovarian pedicles were doubly clamped and cut and the uterus was delivered and sent to pathology. The uterine ovarian pedicles were then doubly ligated using 0 Vicryl suture. The pelvis was irrigated showing adequate hemostasis.  The long weighted speculum was replaced with a shorter weighted speculum, the bowel packed with a tagged tail sponge in the posterior vaginal cuff was run from uterosacral ligament to uterosacral ligament using 0 Vicryl suture in a running interlocking stitch. The bowel packing was removed and the vagina was then closed anterior to posterior using 0 Vicryl suture in interrupted figure-of-eight stitch. The vagina was irrigated showing adequate hemostasis and attention was then turned to the posterior colporrhaphy. A transverse incision was made at the posterior fourchette and using sequential applied Allis clamps the vaginal mucosa was incised in the midline to within 1 fingerbreadth below the vaginal cuff sutures. The pararectal planes were then sharply and bluntly developed without difficulty and the rectocele was then reduced through progressive imbricating interrupted 2-0 Vicryl sutures incorporating the perirectal fascia. The excess vaginal mucosa was then excised and the vaginal mucosa was then closed in a running interlocking stitch using 2-0 Vicryl suture.   The underlying perirectal tissues were corporative into the running stitch to close the pararectal dead space. The perineal body was reinforced with interrupted 2-0 Vicryl sutures and the midline perineal incision was closed using 2-0 Vicryl in a running stitch. Estrace coated vaginal packing was placed. The patient received intraoperative Toradol, clear yellow urine noted in the Foley bag and the patient was awakened without difficulty and taken to the recovery room in good condition having tolerated the procedure well.    Dara Lords MD, 3:10 PM 10/18/2011

## 2011-10-18 NOTE — H&P (Signed)
The patient was examined.  I reviewed the proposed surgery and consent form with the patient.  The dictated history and physical is current and accurate and all questions were answered. The patient is ready to proceed with surgery and has a realistic understanding and expectation for the outcome.   Dara Lords MD, 12:39 PM 10/18/2011

## 2011-10-19 ENCOUNTER — Encounter (HOSPITAL_COMMUNITY): Payer: Self-pay | Admitting: Gynecology

## 2011-10-19 LAB — CBC
MCH: 31.4 pg (ref 26.0–34.0)
Platelets: 257 10*3/uL (ref 150–400)
RBC: 3.5 MIL/uL — ABNORMAL LOW (ref 3.87–5.11)
WBC: 9 10*3/uL (ref 4.0–10.5)

## 2011-10-19 MED ORDER — OXYCODONE-ACETAMINOPHEN 5-325 MG PO TABS: 1.0000 | ORAL_TABLET | ORAL | Status: AC | PRN

## 2011-10-19 NOTE — Progress Notes (Signed)
Pt discharged to home with father.  Condition stable.  Pt requested copies of medical records to submit to her insurance company.  Pt signed consent and was given copies of labs (Mar 1-3), operative note, and MD progress note for morning of discharge.  Consent form placed on physical chart.  Phone number for medical records department written on discharge papers for patient's future reference.  Pt to car via wheelchair with Stevphen Meuse, NT.  No equipment for home ordered at discharge.

## 2011-10-19 NOTE — Discharge Instructions (Signed)
Postoperative Instructions Hysterectomy ° °Dr. Laneice Meneely and the nursing staff have discussed postoperative instructions with you.  If you have any questions please ask them before you leave the hospital, or call Dr Jnaya Butrick’s office at [phone].   ° °We would like to emphasize the following instructions: ° ° °  Call the office to make your follow-up appointment as recommended by Dr Zayd Bonet (usually 2 weeks). ° °  You were given a prescription, or one was ordered for you at the pharmacy you designated.  Get that prescription filled and take the medication according to instructions. ° °  You may eat a regular diet, but slowly until you start having bowel movements. ° °  Drink plenty of water daily. ° °  Nothing in the vagina (intercourse, douching, objects of any kind) until released by Dr Ayron Fillinger. ° °  No driving for two weeks.  Wait to be cleared by Dr Jase Himmelberger at your first post op check.  Car rides (short) are ok after several days at home, as long as you are not having significant pain, but no traveling out of town. ° °  You may shower, but no baths.  Walking up and down stairs is ok.  No heavy lifting, prolonged standing, repeated bending or any “working out” until your first post op check. ° °  Rest frequently, listen to your body and do not push yourself and overdo it. ° °  Call if: ° °o Your pain medication does not seem strong enough. °o Worsening pain or abdominal bloating °o Persistent nausea or vomiting °o Difficulty with urination or bowel movements. °o Temperature of 101 degrees or higher. °o Bleeding heavier then staining (clots or period type flow). °o You have any questions or concerns. °

## 2011-10-19 NOTE — Addendum Note (Signed)
Addendum  created 10/19/11 0800 by Corrin Hingle, CRNA   Modules edited:Notes Section    

## 2011-10-19 NOTE — Progress Notes (Addendum)
Becky Aguilar 1971-07-25 [id number]   1 Day Post-Op Procedure(s) (LRB): HYSTERECTOMY VAGINAL (N/A) POSTERIOR REPAIR (RECTOCELE) ()  Subjective: Patient reports feels well, no complaints, no acute distress, pain severity reported mild, yes taking PO, foley catheter in place, novoiding, yes ambulating, yespassing flatus  Objective: Afeb, VSS   EXAM General: awake, alert, cooperative and no distress Resp: rhonchi clear to auscultation bilaterally Cardio: regular rate and rhythm, S1, S2 normal, no murmur, click, rub or gallop GI: normal findings:soft, non-tender; bowel sounds normal; no masses,  no organomegaly Lower Extremities: Without swelling or tenderness Vaginal Bleeding: Reported scant/vaginal packing removed    Assessment: s/p Procedure(s): HYSTERECTOMY VAGINAL POSTERIOR REPAIR (RECTOCELE): progressing well, ready for discharge.  Plan: Vaginal packing was removed today.  Discharge home today after discontinue foley and voiding.  Precautions, instructions and follow up were discussed with the patient.  Prescriptions provided  Per discharge summary.  Patient to call the office to arrange a post-operative appointmant in 2 weeks.  Dara Lords, MD 10/19/2011 8:17 AM

## 2011-10-19 NOTE — Anesthesia Postprocedure Evaluation (Signed)
  Anesthesia Post-op Note  Patient: Becky Aguilar  Procedure(s) Performed: Procedure(s) (LRB): HYSTERECTOMY VAGINAL (N/A) POSTERIOR REPAIR (RECTOCELE) ()  Patient Location: PACU and Women[facility] Unit  Anesthesia Type: General  Level of Consciousness: awake, alert  and oriented  Airway and Oxygen Therapy: Patient Spontanous Breathing  Post-op Pain: moderate  Post-op Assessment: Post-op Vital signs reviewed, Patient's Cardiovascular Status Stable and Respiratory Function Stable  Post-op Vital Signs: stable  Complications: No apparent anesthesia complications

## 2011-10-19 NOTE — Discharge Summary (Signed)
Becky Aguilar 01-03-1971 [id number]   Discharge Summary  Date of Admission:  10/18/2011  Date of Discharge:  10/19/2011  Discharge Diagnosis:   Uterine prolapse     Rectocele Procedure:  Procedure(s): HYSTERECTOMY VAGINAL POSTERIOR REPAIR (RECTOCELE)  Pathology:  Accession #: [id number] FINAL DIAGNOSIS Diagnosis Uterus and cervix - PROLIFERATIVE PHASE ENDOMETRIUM; NEGATIVE FOR HYPERPLASIA OR MALIGNANCY. - BENIGN ENDOCERVICAL POLYP; NO ATYPIA OR MALIGNANCY PRESENT. - BENIGN CERVICAL MUCOSA; NEGATIVE FOR INTRAEPITHELIAL LESION OR MALIGNANCY. - UNREMARKABLE UTERINE SEROSA.  Hospital Course:  Patient underwent an uncomplicated total vaginal hysterectomy and posterior colporrhaphy 10/18/2011. Her postoperative course was uncomplicated and she was discharged on postoperative day #1 after the vaginal packing removal and Foley catheter removal ambulating well, tolerating a regular diet, voiding without difficulty with scant vaginal bleeding. Her postoperative hemoglobin was 11. The patient received ASAP call precautions, instructions and follow up recommendations to be seen in 2 weeks following discharge. She was given a prescription for pain medication per her discharge AVS will be seen in the office in 2 weeks.    Dara Lords MD, 4:57 PM 10/19/2011

## 2011-11-04 ENCOUNTER — Ambulatory Visit: Payer: Managed Care, Other (non HMO) | Admitting: Gynecology

## 2011-11-05 ENCOUNTER — Encounter: Payer: Self-pay | Admitting: Gynecology

## 2011-11-05 ENCOUNTER — Ambulatory Visit (INDEPENDENT_AMBULATORY_CARE_PROVIDER_SITE_OTHER): Payer: Managed Care, Other (non HMO) | Admitting: Gynecology

## 2011-11-05 DIAGNOSIS — Z9889 Other specified postprocedural states: Secondary | ICD-10-CM

## 2011-11-05 NOTE — Patient Instructions (Signed)
Return in 2 weeks for your next postoperative visit. Continue nothing in the vagina.

## 2011-11-05 NOTE — Progress Notes (Signed)
Patient presents 2 weeks postoperative from [facility] posterior colporrhaphy. She's done well having bowel movements without issue. Does have some sutures externally that she would like removed.  Exam with Sherrilyn Rist chaperone present. External with midline perineal suture line intact with sutures. These were removed. Vagina healing nicely with suture lines intact. Bimanual without masses or tenderness  Assessment and plan: 2 weeks postoperative from [facility] posterior colporrhaphy doing well. We'll slowly resume activities and continue pelvic rest. Return in 2 weeks for her next postoperative checkup and I anticipate return to work at 5 weeks.

## 2011-11-19 ENCOUNTER — Encounter: Payer: Self-pay | Admitting: Gynecology

## 2011-11-19 ENCOUNTER — Ambulatory Visit (INDEPENDENT_AMBULATORY_CARE_PROVIDER_SITE_OTHER): Payer: Managed Care, Other (non HMO) | Admitting: Gynecology

## 2011-11-19 DIAGNOSIS — Z9889 Other specified postprocedural states: Secondary | ICD-10-CM

## 2011-11-19 NOTE — Progress Notes (Signed)
Four-week postop check status post [facility] posterior colporrhaphy. Patient is doing well.  Exam was Sherrilyn Rist chaperone present External BUS vagina with suture line is healing nicely several sutures still in place. Bimanual without masses or tenderness.  Assessment and plan: Normal postop check status post TVH posterior colporrhaphy. Patient resume normal activities with the exception of pelvic rest. I asked her to abstain for another 2 weeks. Assuming she does well then she will see me February 2014 at her annual exam, sooner as needed. She plans to return to work this coming week and I gave her a release for this.

## 2011-11-19 NOTE — Patient Instructions (Signed)
Obstetric intercourse for another 2 weeks. Otherwise resume normal activities. Follow up in February 2014 for annual gynecologic exam, sooner if any issues.

## 2012-03-14 ENCOUNTER — Encounter: Payer: Self-pay | Admitting: Gynecology

## 2012-03-14 ENCOUNTER — Ambulatory Visit (INDEPENDENT_AMBULATORY_CARE_PROVIDER_SITE_OTHER): Payer: Managed Care, Other (non HMO) | Admitting: Gynecology

## 2012-03-14 DIAGNOSIS — N899 Noninflammatory disorder of vagina, unspecified: Secondary | ICD-10-CM

## 2012-03-14 DIAGNOSIS — N898 Other specified noninflammatory disorders of vagina: Secondary | ICD-10-CM

## 2012-03-14 DIAGNOSIS — B373 Candidiasis of vulva and vagina: Secondary | ICD-10-CM

## 2012-03-14 LAB — WET PREP FOR TRICH, YEAST, CLUE: Clue Cells Wet Prep HPF POC: NONE SEEN

## 2012-03-14 MED ORDER — TERCONAZOLE 0.8 % VA CREA: 1.0000 | TOPICAL_CREAM | Freq: Every day | VAGINAL | Status: AC

## 2012-03-14 NOTE — Patient Instructions (Signed)
Used Terazol vaginal cream nightly x3 nights. Follow up if symptoms persist or recur.

## 2012-03-14 NOTE — Addendum Note (Signed)
Addended by: WILKINSON, KARI S on: 03/14/2012 11:41 AM   Modules accepted: Orders  

## 2012-03-14 NOTE — Progress Notes (Signed)
Patient presents complaining of vaginal dryness and irritation since her hysterectomy. Seems worse most recently. She had a oral antibiotic for sinusitis. No discharge or other symptoms such as hot flashes, night sweats.  Exam was Sherrilyn Rist assistant Pelvic external BUS vagina with white discharge. Bimanual without masses or tenderness  Assessment and plan:  Wet prep positive for yeast. We'll treat with Terazol 3 day cream. Follow up if symptoms persist or recur.

## 2012-03-20 ENCOUNTER — Telehealth: Payer: Self-pay | Admitting: *Deleted

## 2012-03-20 MED ORDER — FLUCONAZOLE 200 MG PO TABS: 200.0000 mg | ORAL_TABLET | Freq: Every day | ORAL | Status: AC

## 2012-03-20 NOTE — Telephone Encounter (Signed)
Diflucan 200mg daily x 7 days

## 2012-03-20 NOTE — Telephone Encounter (Signed)
Patient informed. 

## 2012-03-20 NOTE — Telephone Encounter (Signed)
Patient called c/o still vaginal itching and irritation even after finished with Terazol 3.  Is there another rx you can send or does she need to come back in?

## 2012-04-20 ENCOUNTER — Other Ambulatory Visit: Payer: Self-pay | Admitting: Dermatology

## 2012-09-25 ENCOUNTER — Encounter: Payer: Self-pay | Admitting: Gynecology

## 2012-09-25 ENCOUNTER — Ambulatory Visit (INDEPENDENT_AMBULATORY_CARE_PROVIDER_SITE_OTHER): Payer: Managed Care, Other (non HMO) | Admitting: Gynecology

## 2012-09-25 VITALS — BP 110/60 | Ht 69.0 in | Wt 132.0 lb

## 2012-09-25 DIAGNOSIS — N898 Other specified noninflammatory disorders of vagina: Secondary | ICD-10-CM

## 2012-09-25 DIAGNOSIS — N9489 Other specified conditions associated with female genital organs and menstrual cycle: Secondary | ICD-10-CM

## 2012-09-25 DIAGNOSIS — Z01419 Encounter for gynecological examination (general) (routine) without abnormal findings: Secondary | ICD-10-CM

## 2012-09-25 DIAGNOSIS — Z13220 Encounter for screening for lipoid disorders: Secondary | ICD-10-CM

## 2012-09-25 DIAGNOSIS — N951 Menopausal and female climacteric states: Secondary | ICD-10-CM

## 2012-09-25 LAB — LIPID PANEL
Cholesterol: 148 mg/dL (ref 0–200)
HDL: 45 mg/dL (ref >39)
Total CHOL/HDL Ratio: 3.3 Ratio
Triglycerides: 96 mg/dL (ref <150)
VLDL: 19 mg/dL (ref 0–40)

## 2012-09-25 LAB — COMPREHENSIVE METABOLIC PANEL
AST: 13 U/L (ref 0–37)
Alkaline Phosphatase: 53 U/L (ref 39–117)
BUN: 10 mg/dL (ref 6–23)
Creat: 0.65 mg/dL (ref 0.50–1.10)
Potassium: 3.8 mEq/L (ref 3.5–5.3)
Total Bilirubin: 0.3 mg/dL (ref 0.3–1.2)

## 2012-09-25 LAB — CBC WITH DIFFERENTIAL/PLATELET
Basophils Absolute: 0 10*3/uL (ref 0.0–0.1)
Basophils Relative: 1 % (ref 0–1)
HCT: 38.2 % (ref 36.0–46.0)
MCHC: 34 g/dL (ref 30.0–36.0)
Monocytes Absolute: 0.4 10*3/uL (ref 0.1–1.0)
Neutro Abs: 2.8 10*3/uL (ref 1.7–7.7)
RDW: 13.7 % (ref 11.5–15.5)

## 2012-09-25 NOTE — Patient Instructions (Signed)
Office will follow up with you in reference to lab results. If we start estrogen replacement follow up if symptoms continue. Assuming you're doing well then we will see you in one year.

## 2012-09-25 NOTE — Progress Notes (Signed)
Becky Aguilar Feb 04, 1971 [id number]        42 y.o.  G1P1001 for annual exam.  Several issues noted below.  Past medical history,surgical history, medications, allergies, family history and social history were all reviewed and documented in the EPIC chart. ROS:  Was performed and pertinent positives and negatives are included in the history.  Exam: [name] assistant Filed Vitals:   09/25/12 1037  BP: 110/60  Height: 5\' 9"  (1.753 m)  Weight: 132 lb (59.875 kg)   General appearance  Normal Skin grossly normal Head/Neck normal with no cervical or supraclavicular adenopathy thyroid normal Lungs  clear Cardiac RR, without RMG Abdominal  soft, nontender, without masses, organomegaly or hernia Breasts  examined lying and sitting without masses, retractions, discharge or axillary adenopathy. Pelvic  Ext/BUS/vagina  normal with mild cystocele. Cuff well supported, rectovaginal septum well supported.  Adnexa  Without masses or tenderness    Anus and perineum  normal   Rectovaginal  normal sphincter tone without palpated masses or tenderness.    Assessment/Plan:  42 y.o. G26P1001 female for annual exam.   1. Complaints of pelvic fullness. Particularly with standing. Exam overall was normal. She has a slight cystocele but otherwise well supported. Recommend monitoring at present and if persists/worsen she'll follow up with me. Results and will follow. She's not having constipation or other GI symptoms. We'll check urinalysis. 2. Menopausal symptoms. Patient complaining of night sweats as well as vaginal dryness. Has tried OTC products without relief. We'll check baseline studies to include TSH FSH ANA. If all normal options of trial of low-dose estrogen to see if this doesn't help regardless discussed. Risks of ERT to include thrombosis risks such as stroke heart attack DVT discussed. Will await blood results but again if they're normal the patient is wanting to try low dose estrogen. I discussed  patch versus oral, first pass effect. She cannot use it he says or tapes because of irritation and I think that we should go with oral because of this. We'll further review after blood results. 3. Mammography. Patient turning 42 this year and I recommended baseline mammogram she agrees to schedule. SBE monthly reviewed. 4. Pap smear 2013.  No Pap smear done today. No history of significant abnormal Pap smears. Reviewed current screening guidelines innoxious to stop screening altogether if she is status post hysterectomy for benign indication versus less frequent screening reviewed. We'll readdress on an annual basis. 5. History of VIN grade 1 2006. Vulvar exam is normal. Continue expectant management. 6. Health maintenance. Baseline CBC comprehensive metabolic panel lipid profile urinalysis ordered along with her TSH FSH ANA.    Dara Lords MD, 11:23 AM 09/25/2012

## 2012-09-26 ENCOUNTER — Telehealth: Payer: Self-pay | Admitting: Gynecology

## 2012-09-26 LAB — URINALYSIS W MICROSCOPIC + REFLEX CULTURE
Bacteria, UA: NONE SEEN
Bilirubin Urine: NEGATIVE
Ketones, ur: NEGATIVE mg/dL
Protein, ur: NEGATIVE mg/dL
Urobilinogen, UA: 0.2 mg/dL (ref 0.0–1.0)

## 2012-09-26 LAB — ANTI-NUCLEAR AB-TITER (ANA TITER): ANA Titer 1: 1:2560 {titer} — ABNORMAL HIGH

## 2012-09-28 NOTE — Telephone Encounter (Signed)
erroneous

## 2012-09-30 ENCOUNTER — Other Ambulatory Visit: Payer: Self-pay

## 2012-10-02 ENCOUNTER — Telehealth: Payer: Self-pay | Admitting: *Deleted

## 2012-10-02 NOTE — Telephone Encounter (Signed)
(  pt aware you are out of the office) Pt would like to speak with you about her ANA results as noted in result note.I will get pt on phone if needed or her call back # is [phone] opt# 0. Please advise

## 2012-10-03 ENCOUNTER — Telehealth: Payer: Self-pay | Admitting: Gynecology

## 2012-10-03 NOTE — Telephone Encounter (Signed)
Notes faxed to Dr. Walker Kehr office, they will contact pt will time and date for OV.

## 2012-10-03 NOTE — Telephone Encounter (Signed)
Tf spoke with regarding lab results. Another telephone encounter was made regarding this.

## 2012-10-03 NOTE — Telephone Encounter (Signed)
Call patient with ANA results of 1:2560.  Remainder of her hormone levels to include FSH TSH were normal. Recommend follow up with rheumatologist to help sort through this positive number with her symptoms of hot flashes and sweats. Possibilities to include lupus and other connective tissue disorders discussed. Patient agrees and we will arrange an appointment with Dr. Corliss Skains.

## 2012-10-09 NOTE — Telephone Encounter (Signed)
Appt. March 21 st 10:45 am

## 2012-10-19 ENCOUNTER — Other Ambulatory Visit: Payer: Self-pay | Admitting: Dermatology

## 2012-12-06 ENCOUNTER — Other Ambulatory Visit: Payer: Self-pay

## 2012-12-06 DIAGNOSIS — Z1231 Encounter for screening mammogram for malignant neoplasm of breast: Secondary | ICD-10-CM

## 2012-12-22 ENCOUNTER — Other Ambulatory Visit: Payer: Self-pay | Admitting: Rheumatology

## 2012-12-22 ENCOUNTER — Ambulatory Visit
Admission: RE | Admit: 2012-12-22 | Discharge: 2012-12-22 | Disposition: A | Discharge status: Home / Self Care | Payer: Managed Care, Other (non HMO) | Source: Ambulatory Visit | Attending: Rheumatology | Admitting: Rheumatology

## 2012-12-22 DIAGNOSIS — R0602 Shortness of breath: Secondary | ICD-10-CM

## 2013-01-10 ENCOUNTER — Ambulatory Visit: Payer: Managed Care, Other (non HMO)

## 2013-01-23 ENCOUNTER — Other Ambulatory Visit: Payer: Self-pay | Admitting: Rheumatology

## 2013-01-23 ENCOUNTER — Other Ambulatory Visit (HOSPITAL_BASED_OUTPATIENT_CLINIC_OR_DEPARTMENT_OTHER): Payer: Self-pay | Admitting: Rheumatology

## 2013-01-23 DIAGNOSIS — R0602 Shortness of breath: Secondary | ICD-10-CM

## 2013-02-08 ENCOUNTER — Ambulatory Visit
Admission: RE | Admit: 2013-02-08 | Discharge: 2013-02-08 | Disposition: A | Discharge status: Home / Self Care | Payer: Managed Care, Other (non HMO) | Source: Ambulatory Visit

## 2013-02-08 DIAGNOSIS — Z1231 Encounter for screening mammogram for malignant neoplasm of breast: Secondary | ICD-10-CM

## 2013-02-13 ENCOUNTER — Telehealth: Payer: Self-pay | Admitting: *Deleted

## 2013-02-13 MED ORDER — ESTRADIOL 1 MG PO TABS: 1.0000 mg | ORAL_TABLET | Freq: Every day | ORAL | Status: DC

## 2013-02-13 NOTE — Telephone Encounter (Signed)
PT INFORMED WITH THE BELOW NOTE RX SENT 

## 2013-02-13 NOTE — Telephone Encounter (Signed)
Recommend trial of estradiol 1 mg daily x30 days. Have her call the end of this and let me know how she is doing.

## 2013-02-13 NOTE — Telephone Encounter (Signed)
PT SAW DR. Corliss Skains AS RECOMMENDED ON 10/03/12. PT IS HAVING NIGHT SWEATS, VAGINAL DRYNESS, PAINFUL INTERCOURSE SAME SYMPTOMS AS BEFORE. DR. Corliss Skains OFFICE HAS NOT REALLY HELPED WITH ANYTHING. PT ASKED IF YOU WOULD BE WILLING TO GIVE HER SOMETHING TO RELIEVE THE ABOVE SYMPTOMS? MAYBE A TRIAL OF MEDICATION? SHE HAS TRIED OTC LUBRICATION. PLEASE ADVISE

## 2013-03-09 ENCOUNTER — Other Ambulatory Visit: Payer: Self-pay | Admitting: Gynecology

## 2013-03-13 ENCOUNTER — Telehealth: Payer: Self-pay | Admitting: *Deleted

## 2013-03-13 MED ORDER — ESTRADIOL 1 MG PO TABS: 1.0000 mg | ORAL_TABLET | Freq: Every day | ORAL | Status: DC

## 2013-03-13 NOTE — Telephone Encounter (Signed)
Okay to refill through her next annual exam 

## 2013-03-13 NOTE — Telephone Encounter (Signed)
Pt has completed her 30 day trail period (given on 02/13/13)  of estradiol 1 mg tablet pt said working well. Okay to fill?

## 2013-03-13 NOTE — Telephone Encounter (Signed)
rx sent

## 2013-05-03 ENCOUNTER — Other Ambulatory Visit: Payer: Self-pay | Admitting: Dermatology

## 2013-06-21 ENCOUNTER — Other Ambulatory Visit: Payer: Self-pay

## 2013-09-10 ENCOUNTER — Other Ambulatory Visit: Payer: Self-pay | Admitting: Dermatology

## 2013-09-28 ENCOUNTER — Encounter: Payer: Self-pay | Admitting: Gynecology

## 2013-09-28 ENCOUNTER — Ambulatory Visit (INDEPENDENT_AMBULATORY_CARE_PROVIDER_SITE_OTHER): Payer: Managed Care, Other (non HMO) | Admitting: Gynecology

## 2013-09-28 VITALS — BP 110/64 | Ht 69.0 in | Wt 134.0 lb

## 2013-09-28 DIAGNOSIS — Z01419 Encounter for gynecological examination (general) (routine) without abnormal findings: Secondary | ICD-10-CM

## 2013-09-28 DIAGNOSIS — Z113 Encounter for screening for infections with a predominantly sexual mode of transmission: Secondary | ICD-10-CM

## 2013-09-28 DIAGNOSIS — N951 Menopausal and female climacteric states: Secondary | ICD-10-CM

## 2013-09-28 LAB — CBC WITH DIFFERENTIAL/PLATELET
Basophils Absolute: 0 10*3/uL (ref 0.0–0.1)
Basophils Relative: 1 % (ref 0–1)
EOS PCT: 2 % (ref 0–5)
Eosinophils Absolute: 0.1 10*3/uL (ref 0.0–0.7)
HCT: 40.6 % (ref 36.0–46.0)
HEMOGLOBIN: 14.1 g/dL (ref 12.0–15.0)
LYMPHS ABS: 1.3 10*3/uL (ref 0.7–4.0)
LYMPHS PCT: 28 % (ref 12–46)
MCH: 31.5 pg (ref 26.0–34.0)
MCHC: 34.7 g/dL (ref 30.0–36.0)
MCV: 90.8 fL (ref 78.0–100.0)
MONOS PCT: 5 % (ref 3–12)
Monocytes Absolute: 0.3 10*3/uL (ref 0.1–1.0)
Neutro Abs: 3 10*3/uL (ref 1.7–7.7)
Neutrophils Relative %: 64 % (ref 43–77)
PLATELETS: 255 10*3/uL (ref 150–400)
RBC: 4.47 MIL/uL (ref 3.87–5.11)
RDW: 13.5 % (ref 11.5–15.5)
WBC: 4.6 10*3/uL (ref 4.0–10.5)

## 2013-09-28 LAB — COMPREHENSIVE METABOLIC PANEL
ALBUMIN: 4.2 g/dL (ref 3.5–5.2)
ALK PHOS: 38 U/L — AB (ref 39–117)
ALT: 10 U/L (ref 0–35)
AST: 12 U/L (ref 0–37)
BUN: 9 mg/dL (ref 6–23)
CO2: 29 mEq/L (ref 19–32)
Calcium: 9 mg/dL (ref 8.4–10.5)
Chloride: 104 mEq/L (ref 96–112)
Creat: 0.67 mg/dL (ref 0.50–1.10)
Glucose, Bld: 59 mg/dL — ABNORMAL LOW (ref 70–99)
POTASSIUM: 3.5 meq/L (ref 3.5–5.3)
SODIUM: 139 meq/L (ref 135–145)
TOTAL PROTEIN: 6.4 g/dL (ref 6.0–8.3)
Total Bilirubin: 0.5 mg/dL (ref 0.2–1.2)

## 2013-09-28 LAB — LIPID PANEL
CHOL/HDL RATIO: 2.6 ratio
CHOLESTEROL: 156 mg/dL (ref 0–200)
HDL: 59 mg/dL (ref >39)
LDL Cholesterol: 84 mg/dL (ref 0–99)
Triglycerides: 67 mg/dL (ref <150)
VLDL: 13 mg/dL (ref 0–40)

## 2013-09-28 LAB — FOLLICLE STIMULATING HORMONE: FSH: 14.5 m[IU]/mL

## 2013-09-28 LAB — HEPATITIS B SURFACE ANTIGEN: Hepatitis B Surface Ag: NEGATIVE

## 2013-09-28 LAB — HEPATITIS C ANTIBODY: HCV AB: NEGATIVE

## 2013-09-28 LAB — HIV ANTIBODY (ROUTINE TESTING W REFLEX): HIV: NONREACTIVE

## 2013-09-28 LAB — RPR

## 2013-09-28 MED ORDER — ESTRADIOL 1 MG PO TABS: 1.0000 mg | ORAL_TABLET | Freq: Two times a day (BID) | ORAL | Status: DC

## 2013-09-28 NOTE — Patient Instructions (Signed)

## 2013-09-28 NOTE — Progress Notes (Signed)
Becky Aguilar Jan 06, 1971 [id number]        43 y.o.  G1P1001 for annual exam.  Several issues noted below.  Past medical history,surgical history, problem list, medications, allergies, family history and social history were all reviewed and documented in the EPIC chart.  ROS:  Performed and pertinent positives and negatives are included in the history, assessment and plan .  Exam: [name] assistant Filed Vitals:   09/28/13 1139  BP: 110/64  Height: 5' 9"  (1.753 m)  Weight: 134 lb (60.782 kg)   General appearance  Normal Skin grossly normal Head/Neck normal with no cervical or supraclavicular adenopathy thyroid normal Lungs  clear Cardiac RR, without RMG Abdominal  soft, nontender, without masses, organomegaly or hernia Breasts  examined lying and sitting without masses, retractions, discharge or axillary adenopathy. Pelvic  Ext/BUS/vagina  Normal  Adnexa  Without masses or tenderness    Anus and perineum  Normal   Rectovaginal  Normal sphincter tone without palpated masses or tenderness.    Assessment/Plan:  43 y.o. G56P1001 female for annual exam.   1. Menopausal symptoms. Patient still having hot flashes. Had a normal [place] previously. Was started on estradiol arbitrarily and actually had a good response noting a decrease in her hot flushes. She finds that when she takes 1-1/2 mg he seems to do better. I again reviewed the whole issue of treatment in the face of a normal at this age but I think she probably is normal laboratory-wise but sensing declining estrogen levels which responded to her Estrace replacement. Patient's comfortable continuing. I again discussed the risks to include increased risk of thrombosis such as stroke heart attack DVT and possible increased risk of breast cancer. Estrace 1 mg twice daily refill x1 year. 2. Elevated ANA. She was evaluated by rheumatology on several occasions due to an elevated isolated ANA and they were unable to find any GERD after an  extensive workup. Offered to recheck her ANA today but not sure what we do with the results and she is comfortable with just monitoring. 3. Pap smear 2013. No Pap smear done today. No history of abnormal Pap smears previously. Status post hysterectomy for prolapse. Options to stop screening versus less frequent screening intervals reviewed. Will readdress on annual basis. 4. Mammography 01/2013. Continue with annual mammography. SBE monthly reviewed. 5. STD screening. Patient asked to have RPR HIV hepatitis B and C. done. No known exposure and currently is not sexually active. Declined GC and Chlamydia screen. 6. Health maintenance. Baseline CBC comprehensive metabolic panel lipid profile urinalysis ordered with STD blood work. Followup one year, sooner as needed.   Note: This document was prepared with digital dictation and possible smart phrase technology. Any transcriptional errors that result from this process are unintentional.   Anastasio Auerbach MD, 12:50 PM 09/28/2013

## 2013-09-29 LAB — URINALYSIS W MICROSCOPIC + REFLEX CULTURE
BILIRUBIN URINE: NEGATIVE
Casts: NONE SEEN
Crystals: NONE SEEN
GLUCOSE, UA: NEGATIVE mg/dL
HGB URINE DIPSTICK: NEGATIVE
Ketones, ur: NEGATIVE mg/dL
Leukocytes, UA: NEGATIVE
Nitrite: NEGATIVE
PROTEIN: NEGATIVE mg/dL
Specific Gravity, Urine: 1.018 (ref 1.005–1.030)
Urobilinogen, UA: 0.2 mg/dL (ref 0.0–1.0)
pH: 6.5 (ref 5.0–8.0)

## 2013-10-01 ENCOUNTER — Other Ambulatory Visit: Payer: Self-pay | Admitting: Gynecology

## 2013-10-01 LAB — URINE CULTURE

## 2013-10-01 MED ORDER — AMPICILLIN 500 MG PO CAPS
500.0000 mg | ORAL_CAPSULE | Freq: Four times a day (QID) | ORAL | Status: DC
Start: 2013-10-01 — End: 2014-10-11

## 2013-11-01 ENCOUNTER — Other Ambulatory Visit: Payer: Self-pay | Admitting: Dermatology

## 2014-03-22 ENCOUNTER — Other Ambulatory Visit: Payer: Self-pay

## 2014-03-22 DIAGNOSIS — Z1231 Encounter for screening mammogram for malignant neoplasm of breast: Secondary | ICD-10-CM

## 2014-04-03 ENCOUNTER — Ambulatory Visit: Payer: Managed Care, Other (non HMO)

## 2014-04-30 ENCOUNTER — Telehealth: Payer: Self-pay | Admitting: *Deleted

## 2014-04-30 NOTE — Telephone Encounter (Signed)
Pt called c/o pain with intercourse taking Replens and taking estrogen 2 mg, pt informed OV for exam.

## 2014-05-02 ENCOUNTER — Ambulatory Visit: Payer: Managed Care, Other (non HMO) | Admitting: Gynecology

## 2014-05-07 ENCOUNTER — Other Ambulatory Visit: Payer: Self-pay | Admitting: Dermatology

## 2014-06-03 ENCOUNTER — Telehealth: Payer: Self-pay

## 2014-06-03 MED ORDER — ESTRADIOL 2 MG PO TABS: 2.0000 mg | ORAL_TABLET | Freq: Every day | ORAL | Status: DC

## 2014-06-03 NOTE — Telephone Encounter (Signed)
okay

## 2014-06-03 NOTE — Telephone Encounter (Signed)
Pharmacy called requesting 90 day supply for patient's Estradiol 60m tab.  They included the question "Could we do Estradiol 2 mg #90 and have her use 1/2 tab bid?"

## 2014-06-03 NOTE — Telephone Encounter (Signed)
Rx sent 

## 2014-06-17 ENCOUNTER — Encounter: Payer: Self-pay | Admitting: Gynecology

## 2014-10-02 ENCOUNTER — Encounter: Payer: Managed Care, Other (non HMO) | Admitting: Gynecology

## 2014-10-11 ENCOUNTER — Other Ambulatory Visit (HOSPITAL_COMMUNITY)
Admission: RE | Admit: 2014-10-11 | Discharge: 2014-10-11 | Disposition: A | Discharge status: Home / Self Care | Payer: Managed Care, Other (non HMO) | Source: Ambulatory Visit | Attending: Gynecology | Admitting: Gynecology

## 2014-10-11 ENCOUNTER — Ambulatory Visit (INDEPENDENT_AMBULATORY_CARE_PROVIDER_SITE_OTHER): Payer: Managed Care, Other (non HMO) | Admitting: Gynecology

## 2014-10-11 ENCOUNTER — Encounter: Payer: Self-pay | Admitting: Gynecology

## 2014-10-11 VITALS — BP 104/60 | Ht 69.0 in | Wt 134.0 lb

## 2014-10-11 DIAGNOSIS — R87810 Cervical high risk human papillomavirus (HPV) DNA test positive: Secondary | ICD-10-CM | POA: Insufficient documentation

## 2014-10-11 DIAGNOSIS — Z01419 Encounter for gynecological examination (general) (routine) without abnormal findings: Secondary | ICD-10-CM | POA: Diagnosis not present

## 2014-10-11 DIAGNOSIS — Z1151 Encounter for screening for human papillomavirus (HPV): Secondary | ICD-10-CM | POA: Insufficient documentation

## 2014-10-11 DIAGNOSIS — N951 Menopausal and female climacteric states: Secondary | ICD-10-CM

## 2014-10-11 LAB — CBC WITH DIFFERENTIAL/PLATELET
BASOS ABS: 0 10*3/uL (ref 0.0–0.1)
Basophils Relative: 1 % (ref 0–1)
Eosinophils Absolute: 0.1 10*3/uL (ref 0.0–0.7)
Eosinophils Relative: 3 % (ref 0–5)
HEMATOCRIT: 41.8 % (ref 36.0–46.0)
Hemoglobin: 13.9 g/dL (ref 12.0–15.0)
Lymphocytes Relative: 32 % (ref 12–46)
Lymphs Abs: 1.5 10*3/uL (ref 0.7–4.0)
MCH: 31.2 pg (ref 26.0–34.0)
MCHC: 33.3 g/dL (ref 30.0–36.0)
MCV: 93.9 fL (ref 78.0–100.0)
MONOS PCT: 8 % (ref 3–12)
MPV: 10.5 fL (ref 8.6–12.4)
Monocytes Absolute: 0.4 10*3/uL (ref 0.1–1.0)
NEUTROS ABS: 2.7 10*3/uL (ref 1.7–7.7)
Neutrophils Relative %: 56 % (ref 43–77)
Platelets: 305 10*3/uL (ref 150–400)
RBC: 4.45 MIL/uL (ref 3.87–5.11)
RDW: 13.8 % (ref 11.5–15.5)
WBC: 4.8 10*3/uL (ref 4.0–10.5)

## 2014-10-11 LAB — COMPREHENSIVE METABOLIC PANEL
ALT: 10 U/L (ref 0–35)
AST: 12 U/L (ref 0–37)
Albumin: 4.1 g/dL (ref 3.5–5.2)
Alkaline Phosphatase: 47 U/L (ref 39–117)
BILIRUBIN TOTAL: 0.5 mg/dL (ref 0.2–1.2)
BUN: 10 mg/dL (ref 6–23)
CALCIUM: 9.4 mg/dL (ref 8.4–10.5)
CHLORIDE: 106 meq/L (ref 96–112)
CO2: 25 mEq/L (ref 19–32)
Creat: 0.71 mg/dL (ref 0.50–1.10)
Glucose, Bld: 61 mg/dL — ABNORMAL LOW (ref 70–99)
Potassium: 4.3 mEq/L (ref 3.5–5.3)
Sodium: 141 mEq/L (ref 135–145)
Total Protein: 6.7 g/dL (ref 6.0–8.3)

## 2014-10-11 LAB — LIPID PANEL
CHOL/HDL RATIO: 2.5 ratio
CHOLESTEROL: 155 mg/dL (ref 0–200)
HDL: 63 mg/dL (ref >=46)
LDL Cholesterol: 81 mg/dL (ref 0–99)
Triglycerides: 55 mg/dL (ref <150)
VLDL: 11 mg/dL (ref 0–40)

## 2014-10-11 LAB — TSH: TSH: 0.775 u[IU]/mL (ref 0.350–4.500)

## 2014-10-11 LAB — ESTRADIOL: ESTRADIOL: 57.1 pg/mL

## 2014-10-11 NOTE — Patient Instructions (Signed)
Office will call you with the lab results.  Call to Schedule your mammogram  Facilities in [place]: 1)  [facility], [address] Phone: [phone] 2)  [facility] [address] [place] Phone: [phone] 3)  Dr. Isaiah Blakes at [facility] [address] Phone: [phone]     Mammogram A mammogram is an X-ray test to find changes in a woman's breast. You should get a mammogram if:  You are 75 years of age or older  You have risk factors.   Your doctor recommends that you have one.  BEFORE THE TEST  Do not schedule the test the week before your period, especially if your breasts are sore during this time.  On the day of your mammogram:  Wash your breasts and armpits well. After washing, do not put on any deodorant or talcum powder on until after your test.   Eat and drink as you usually do.   Take your medicines as usual.   If you are diabetic and take insulin, make sure you:   Eat before coming for your test.   Take your insulin as usual.   If you cannot keep your appointment, call before the appointment to cancel. Schedule another appointment.  TEST  You will need to undress from the waist up. You will put on a hospital gown.   Your breast will be put on the mammogram machine, and it will press firmly on your breast with a piece of plastic called a compression paddle. This will make your breast flatter so that the machine can X-ray all parts of your breast.   Both breasts will be X-rayed. Each breast will be X-rayed from above and from the side. An X-ray might need to be taken again if the picture is not good enough.   The mammogram will last about 15 to 30 minutes.  AFTER THE TEST Finding out the results of your test Ask when your test results will be ready. Make sure you get your test results.  Document Released: 10/29/2008 Document Revised: 07/22/2011  Document Reviewed: 10/29/2008 [facility] Patient Information 2012 [place].  You may obtain a copy of any labs that were done today by logging onto MyChart as outlined in the instructions provided with your AVS (after visit summary). The office will not call with normal lab results but certainly if there are any significant abnormalities then we will contact you.   Health Maintenance, Female A healthy lifestyle and preventative care can promote health and wellness.  Maintain regular health, dental, and eye exams.  Eat a healthy diet. Foods like vegetables, fruits, whole grains, low-fat dairy products, and lean protein foods contain the nutrients you need without too many calories. Decrease your intake of foods high in solid fats, added sugars, and salt. Get information about a proper diet from your caregiver, if necessary.  Regular physical exercise is one of the most important things you can do for your health. Most adults should get at least 150 minutes of moderate-intensity exercise (any activity that increases your heart rate and causes you to sweat) each week. In addition, most adults need muscle-strengthening exercises on 2 or more days a week.   Maintain a healthy weight. The body mass index (BMI) is a screening tool to identify possible weight problems. It provides an estimate of body fat based on height and weight. Your caregiver can help determine your BMI, and can help  you achieve or maintain a healthy weight. For adults 20 years and older:  A BMI below 18.5 is considered underweight.  A BMI of 18.5 to 24.9 is normal.  A BMI of 25 to 29.9 is considered overweight.  A BMI of 30 and above is considered obese.  Maintain normal blood lipids and cholesterol by exercising and minimizing your intake of saturated fat. Eat a balanced diet with plenty of fruits and vegetables. Blood tests for lipids and cholesterol should begin at age 20 and be repeated every 5 years. If your lipid or  cholesterol levels are high, you are over 50, or you are a high risk for heart disease, you may need your cholesterol levels checked more frequently.Ongoing high lipid and cholesterol levels should be treated with medicines if diet and exercise are not effective.  If you smoke, find out from your caregiver how to quit. If you do not use tobacco, do not start.  Lung cancer screening is recommended for adults aged 55 80 years who are at high risk for developing lung cancer because of a history of smoking. Yearly low-dose computed tomography (CT) is recommended for people who have at least a 30-pack-year history of smoking and are a current smoker or have quit within the past 15 years. A pack year of smoking is smoking an average of 1 pack of cigarettes a day for 1 year (for example: 1 pack a day for 30 years or 2 packs a day for 15 years). Yearly screening should continue until the smoker has stopped smoking for at least 15 years. Yearly screening should also be stopped for people who develop a health problem that would prevent them from having lung cancer treatment.  If you are pregnant, do not drink alcohol. If you are breastfeeding, be very cautious about drinking alcohol. If you are not pregnant and choose to drink alcohol, do not exceed 1 drink per day. One drink is considered to be 12 ounces (355 mL) of beer, 5 ounces (148 mL) of wine, or 1.5 ounces (44 mL) of liquor.  Avoid use of street drugs. Do not share needles with anyone. Ask for help if you need support or instructions about stopping the use of drugs.  High blood pressure causes heart disease and increases the risk of stroke. Blood pressure should be checked at least every 1 to 2 years. Ongoing high blood pressure should be treated with medicines, if weight loss and exercise are not effective.  If you are 55 to 42 years old, ask your caregiver if you should take aspirin to prevent strokes.  Diabetes screening involves taking a blood sample  to check your fasting blood sugar level. This should be done once every 3 years, after age 45, if you are within normal weight and without risk factors for diabetes. Testing should be considered at a younger age or be carried out more frequently if you are overweight and have at least 1 risk factor for diabetes.  Breast cancer screening is essential preventative care for women. You should practice "breast self-awareness." This means understanding the normal appearance and feel of your breasts and may include breast self-examination. Any changes detected, no matter how small, should be reported to a caregiver. Women in their 20s and 30s should have a clinical breast exam (CBE) by a caregiver as part of a regular health exam every 1 to 3 years. After age 40, women should have a CBE every year. Starting at age 40, women should consider having a   mammogram (breast X-ray) every year. Women who have a family history of breast cancer should talk to their caregiver about genetic screening. Women at a high risk of breast cancer should talk to their caregiver about having an MRI and a mammogram every year.  Breast cancer gene (BRCA)-related cancer risk assessment is recommended for women who have family members with BRCA-related cancers. BRCA-related cancers include breast, ovarian, tubal, and peritoneal cancers. Having family members with these cancers may be associated with an increased risk for harmful changes (mutations) in the breast cancer genes BRCA1 and BRCA2. Results of the assessment will determine the need for genetic counseling and BRCA1 and BRCA2 testing.  The Pap test is a screening test for cervical cancer. Women should have a Pap test starting at age 21. Between ages 21 and 29, Pap tests should be repeated every 2 years. Beginning at age 30, you should have a Pap test every 3 years as long as the past 3 Pap tests have been normal. If you had a hysterectomy for a problem that was not cancer or a condition  that could lead to cancer, then you no longer need Pap tests. If you are between ages 65 and 70, and you have had normal Pap tests going back 10 years, you no longer need Pap tests. If you have had past treatment for cervical cancer or a condition that could lead to cancer, you need Pap tests and screening for cancer for at least 20 years after your treatment. If Pap tests have been discontinued, risk factors (such as a new sexual partner) need to be reassessed to determine if screening should be resumed. Some women have medical problems that increase the chance of getting cervical cancer. In these cases, your caregiver may recommend more frequent screening and Pap tests.  The human papillomavirus (HPV) test is an additional test that may be used for cervical cancer screening. The HPV test looks for the virus that can cause the cell changes on the cervix. The cells collected during the Pap test can be tested for HPV. The HPV test could be used to screen women aged 30 years and older, and should be used in women of any age who have unclear Pap test results. After the age of 30, women should have HPV testing at the same frequency as a Pap test.  Colorectal cancer can be detected and often prevented. Most routine colorectal cancer screening begins at the age of 50 and continues through age 75. However, your caregiver may recommend screening at an earlier age if you have risk factors for colon cancer. On a yearly basis, your caregiver may provide home test kits to check for hidden blood in the stool. Use of a small camera at the end of a tube, to directly examine the colon (sigmoidoscopy or colonoscopy), can detect the earliest forms of colorectal cancer. Talk to your caregiver about this at age 50, when routine screening begins. Direct examination of the colon should be repeated every 5 to 10 years through age 75, unless early forms of pre-cancerous polyps or small growths are found.  Hepatitis C blood testing is  recommended for all people born from 1945 through 1965 and any individual with known risks for hepatitis C.  Practice safe sex. Use condoms and avoid high-risk sexual practices to reduce the spread of sexually transmitted infections (STIs). Sexually active women aged 25 and younger should be checked for Chlamydia, which is a common sexually transmitted infection. Older women with new or multiple   partners should also be tested for Chlamydia. Testing for other STIs is recommended if you are sexually active and at increased risk.  Osteoporosis is a disease in which the bones lose minerals and strength with aging. This can result in serious bone fractures. The risk of osteoporosis can be identified using a bone density scan. Women ages 37 and over and women at risk for fractures or osteoporosis should discuss screening with their caregivers. Ask your caregiver whether you should be taking a calcium supplement or vitamin D to reduce the rate of osteoporosis.  Menopause can be associated with physical symptoms and risks. Hormone replacement therapy is available to decrease symptoms and risks. You should talk to your caregiver about whether hormone replacement therapy is right for you.  Use sunscreen. Apply sunscreen liberally and repeatedly throughout the day. You should seek shade when your shadow is shorter than you. Protect yourself by wearing long sleeves, pants, a wide-brimmed hat, and sunglasses year round, whenever you are outdoors.  Notify your caregiver of new moles or changes in moles, especially if there is a change in shape or color. Also notify your caregiver if a mole is larger than the size of a pencil eraser.  Stay current with your immunizations. Document Released: 02/15/2011 Document Revised: 11/27/2012 Document Reviewed: 02/15/2011 [facility] Patient Information 2014 [place].

## 2014-10-11 NOTE — Addendum Note (Signed)
Addended by: Nelva Nay on: 10/11/2014 11:46 AM   Modules accepted: Orders

## 2014-10-11 NOTE — Progress Notes (Signed)
Becky Aguilar 1970/09/07 [id number]        44 y.o.  G1P1001 for annual exam.  Doing well. Several issues noted below.  Past medical history,surgical history, problem list, medications, allergies, family history and social history were all reviewed and documented as reviewed in the EPIC chart.  ROS:  Performed with pertinent positives and negatives included in the history, assessment and plan.   Additional significant findings :  none   Exam: [name] Counsellor Vitals:   10/11/14 1015  BP: 104/60  Height: 5' 9"  (1.753 m)  Weight: 134 lb (60.782 kg)   General appearance:  Normal affect, orientation and appearance. Skin: Grossly normal HEENT: Without gross lesions.  No cervical or supraclavicular adenopathy. Thyroid normal.  Lungs:  Clear without wheezing, rales or rhonchi Cardiac: RR, without RMG Abdominal:  Soft, nontender, without masses, guarding, rebound, organomegaly or hernia Breasts:  Examined lying and sitting without masses, retractions, discharge or axillary adenopathy. Pelvic:  Ext/BUS/vagina normal. Pap of cuff done  Adnexa  Without masses or tenderness    Anus and perineum  Normal   Rectovaginal  Normal sphincter tone without palpated masses or tenderness.    Assessment/Plan:  44 y.o. G87P1001 female for annual exam.   1. Menopausal symptoms.  Status post TVH posterior colporrhaphy for uterine prolapse rectocele 2013. Is doing well from that standpoint.  Started on Estrace for menopausal symptoms noting a normal FSH. Seem to have good response to this but is at 2 mg daily. Still notes some hot flashes particularly at night. Check estradiol level today for absorption. Otherwise will continue on this for now. We discussed the risks include stroke heart attack DVT and breast cancer. 2. Pap smear 2013. Pap smear done today. History of hysterectomy for benign indications.  No history of abnormal Pap smears. 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 3. 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 Continue with self all of our exams and report any lesions. Otherwise annual provider exams. 4. Mammography overdo and patients going to schedule this. She does have extremely dense breasts. Reviewed detection rate with screening and possible increased breast cancer risk. Alternative options to include ultrasound MRI also reviewed. At this point will stay with 3-D mammography on an annual basis and patient is comfortable with this. 5. Health maintenance. Baseline CBC, comprehensive metabolic panel, lipid profile, urinalysis, TSH and estradiol ordered.  Follow up in one year, sooner as needed.     Anastasio Auerbach MD, 10:39 AM 10/11/2014

## 2014-10-12 LAB — URINALYSIS W MICROSCOPIC + REFLEX CULTURE
Bilirubin Urine: NEGATIVE
CASTS: NONE SEEN
Crystals: NONE SEEN
Glucose, UA: NEGATIVE mg/dL
Hgb urine dipstick: NEGATIVE
Ketones, ur: NEGATIVE mg/dL
Leukocytes, UA: NEGATIVE
Nitrite: NEGATIVE
Protein, ur: NEGATIVE mg/dL
SPECIFIC GRAVITY, URINE: 1.019 (ref 1.005–1.030)
UROBILINOGEN UA: 0.2 mg/dL (ref 0.0–1.0)
pH: 7 (ref 5.0–8.0)

## 2014-10-15 LAB — CYTOLOGY - PAP

## 2014-10-30 ENCOUNTER — Encounter: Payer: Self-pay | Admitting: Gynecology

## 2014-11-19 ENCOUNTER — Ambulatory Visit
Admission: RE | Admit: 2014-11-19 | Discharge: 2014-11-19 | Disposition: A | Discharge status: Home / Self Care | Payer: Managed Care, Other (non HMO) | Source: Ambulatory Visit

## 2014-11-19 DIAGNOSIS — Z1231 Encounter for screening mammogram for malignant neoplasm of breast: Secondary | ICD-10-CM

## 2015-10-20 ENCOUNTER — Encounter: Payer: Managed Care, Other (non HMO) | Admitting: Gynecology

## 2015-10-24 ENCOUNTER — Encounter: Payer: Managed Care, Other (non HMO) | Admitting: Gynecology

## 2015-11-13 ENCOUNTER — Ambulatory Visit (INDEPENDENT_AMBULATORY_CARE_PROVIDER_SITE_OTHER): Payer: Managed Care, Other (non HMO) | Admitting: Gynecology

## 2015-11-13 ENCOUNTER — Encounter: Payer: Self-pay | Admitting: Gynecology

## 2015-11-13 VITALS — BP 116/74 | Ht 69.0 in | Wt 134.0 lb

## 2015-11-13 DIAGNOSIS — Z01419 Encounter for gynecological examination (general) (routine) without abnormal findings: Secondary | ICD-10-CM | POA: Diagnosis not present

## 2015-11-13 DIAGNOSIS — R87810 Cervical high risk human papillomavirus (HPV) DNA test positive: Secondary | ICD-10-CM | POA: Diagnosis not present

## 2015-11-13 DIAGNOSIS — N9089 Other specified noninflammatory disorders of vulva and perineum: Secondary | ICD-10-CM

## 2015-11-13 DIAGNOSIS — R3915 Urgency of urination: Secondary | ICD-10-CM | POA: Diagnosis not present

## 2015-11-13 MED ORDER — ESTRADIOL 2 MG PO TABS: 2.0000 mg | ORAL_TABLET | Freq: Every day | ORAL | Status: DC

## 2015-11-13 NOTE — Addendum Note (Signed)
Addended by: Anastasio Auerbach on: 11/13/2015 12:25 PM   Modules accepted: Orders

## 2015-11-13 NOTE — Patient Instructions (Signed)
Office will call you with biopsy results.  You may obtain a copy of any labs that were done today by logging onto MyChart as outlined in the instructions provided with your AVS (after visit summary). The office will not call with normal lab results but certainly if there are any significant abnormalities then we will contact you.   Health Maintenance Adopting a healthy lifestyle and getting preventive care can go a long way to promote health and wellness. Talk with your health care provider about what schedule of regular examinations is right for you. This is a good chance for you to check in with your provider about disease prevention and staying healthy. In between checkups, there are plenty of things you can do on your own. Experts have done a lot of research about which lifestyle changes and preventive measures are most likely to keep you healthy. Ask your health care provider for more information. WEIGHT AND DIET  Eat a healthy diet  Be sure to include plenty of vegetables, fruits, low-fat dairy products, and lean protein.  Do not eat a lot of foods high in solid fats, added sugars, or salt.  Get regular exercise. This is one of the most important things you can do for your health.  Most adults should exercise for at least 150 minutes each week. The exercise should increase your heart rate and make you sweat (moderate-intensity exercise).  Most adults should also do strengthening exercises at least twice a week. This is in addition to the moderate-intensity exercise.  Maintain a healthy weight  Body mass index (BMI) is a measurement that can be used to identify possible weight problems. It estimates body fat based on height and weight. Your health care provider can help determine your BMI and help you achieve or maintain a healthy weight.  For females 20 years of age and older:   A BMI below 18.5 is considered underweight.  A BMI of 18.5 to 24.9 is normal.  A BMI of 25 to 29.9 is  considered overweight.  A BMI of 30 and above is considered obese.  Watch levels of cholesterol and blood lipids  You should start having your blood tested for lipids and cholesterol at 43 years of age, then have this test every 5 years.  You may need to have your cholesterol levels checked more often if:  Your lipid or cholesterol levels are high.  You are older than 43 years of age.  You are at high risk for heart disease.  CANCER SCREENING   Lung Cancer  Lung cancer screening is recommended for adults 55-80 years old who are at high risk for lung cancer because of a history of smoking.  A yearly low-dose CT scan of the lungs is recommended for people who:  Currently smoke.  Have quit within the past 15 years.  Have at least a 30-pack-year history of smoking. A pack year is smoking an average of one pack of cigarettes a day for 1 year.  Yearly screening should continue until it has been 15 years since you quit.  Yearly screening should stop if you develop a health problem that would prevent you from having lung cancer treatment.  Breast Cancer  Practice breast self-awareness. This means understanding how your breasts normally appear and feel.  It also means doing regular breast self-exams. Let your health care provider know about any changes, no matter how small.  If you are in your 20s or 30s, you should have a clinical breast exam (  CBE) by a health care provider every 1-3 years as part of a regular health exam.  If you are 56 or older, have a CBE every year. Also consider having a breast X-ray (mammogram) every year.  If you have a family history of breast cancer, talk to your health care provider about genetic screening.  If you are at high risk for breast cancer, talk to your health care provider about having an MRI and a mammogram every year.  Breast cancer gene (BRCA) assessment is recommended for women who have family members with BRCA-related cancers.  BRCA-related cancers include:  Breast.  Ovarian.  Tubal.  Peritoneal cancers.  Results of the assessment will determine the need for genetic counseling and BRCA1 and BRCA2 testing. Cervical Cancer Routine pelvic examinations to screen for cervical cancer are no longer recommended for nonpregnant women who are considered low risk for cancer of the pelvic organs (ovaries, uterus, and vagina) and who do not have symptoms. A pelvic examination may be necessary if you have symptoms including those associated with pelvic infections. Ask your health care provider if a screening pelvic exam is right for you.   The Pap test is the screening test for cervical cancer for women who are considered at risk.  If you had a hysterectomy for a problem that was not cancer or a condition that could lead to cancer, then you no longer need Pap tests.  If you are older than 65 years, and you have had normal Pap tests for the past 10 years, you no longer need to have Pap tests.  If you have had past treatment for cervical cancer or a condition that could lead to cancer, you need Pap tests and screening for cancer for at least 20 years after your treatment.  If you no longer get a Pap test, assess your risk factors if they change (such as having a new sexual partner). This can affect whether you should start being screened again.  Some women have medical problems that increase their chance of getting cervical cancer. If this is the case for you, your health care provider may recommend more frequent screening and Pap tests.  The human papillomavirus (HPV) test is another test that may be used for cervical cancer screening. The HPV test looks for the virus that can cause cell changes in the cervix. The cells collected during the Pap test can be tested for HPV.  The HPV test can be used to screen women 39 years of age and older. Getting tested for HPV can extend the interval between normal Pap tests from three to  five years.  An HPV test also should be used to screen women of any age who have unclear Pap test results.  After 43 years of age, women should have HPV testing as often as Pap tests.  Colorectal Cancer  This type of cancer can be detected and often prevented.  Routine colorectal cancer screening usually begins at 43 years of age and continues through 43 years of age.  Your health care provider may recommend screening at an earlier age if you have risk factors for colon cancer.  Your health care provider may also recommend using home test kits to check for hidden blood in the stool.  A small camera at the end of a tube can be used to examine your colon directly (sigmoidoscopy or colonoscopy). This is done to check for the earliest forms of colorectal cancer.  Routine screening usually begins at age 77.  Direct examination of the colon should be repeated every 5-10 years through 43 years of age. However, you may need to be screened more often if early forms of precancerous polyps or small growths are found. Skin Cancer  Check your skin from head to toe regularly.  Tell your health care provider about any new moles or changes in moles, especially if there is a change in a mole's shape or color.  Also tell your health care provider if you have a mole that is larger than the size of a pencil eraser.  Always use sunscreen. Apply sunscreen liberally and repeatedly throughout the day.  Protect yourself by wearing long sleeves, pants, a wide-brimmed hat, and sunglasses whenever you are outside. HEART DISEASE, DIABETES, AND HIGH BLOOD PRESSURE   Have your blood pressure checked at least every 1-2 years. High blood pressure causes heart disease and increases the risk of stroke.  If you are between 76 years and 55 years old, ask your health care provider if you should take aspirin to prevent strokes.  Have regular diabetes screenings. This involves taking a blood sample to check your  fasting blood sugar level.  If you are at a normal weight and have a low risk for diabetes, have this test once every three years after 43 years of age.  If you are overweight and have a high risk for diabetes, consider being tested at a younger age or more often. PREVENTING INFECTION  Hepatitis B  If you have a higher risk for hepatitis B, you should be screened for this virus. You are considered at high risk for hepatitis B if:  You were born in a country where hepatitis B is common. Ask your health care provider which countries are considered high risk.  Your parents were born in a high-risk country, and you have not been immunized against hepatitis B (hepatitis B vaccine).  You have HIV or AIDS.  You use needles to inject street drugs.  You live with someone who has hepatitis B.  You have had sex with someone who has hepatitis B.  You get hemodialysis treatment.  You take certain medicines for conditions, including cancer, organ transplantation, and autoimmune conditions. Hepatitis C  Blood testing is recommended for:  Everyone born from [birth year] through 1963.  Anyone with known risk factors for hepatitis C. Sexually transmitted infections (STIs)  You should be screened for sexually transmitted infections (STIs) including gonorrhea and chlamydia if:  You are sexually active and are younger than 43 years of age.  You are older than 43 years of age and your health care provider tells you that you are at risk for this type of infection.  Your sexual activity has changed since you were last screened and you are at an increased risk for chlamydia or gonorrhea. Ask your health care provider if you are at risk.  If you do not have HIV, but are at risk, it may be recommended that you take a prescription medicine daily to prevent HIV infection. This is called pre-exposure prophylaxis (PrEP). You are considered at risk if:  You are sexually active and do not regularly use condoms or  know the HIV status of your partner(s).  You take drugs by injection.  You are sexually active with a partner who has HIV. Talk with your health care provider about whether you are at high risk of being infected with HIV. If you choose to begin PrEP, you should first be tested for HIV. You should then be  tested every 3 months for as long as you are taking PrEP.  PREGNANCY   If you are premenopausal and you may become pregnant, ask your health care provider about preconception counseling.  If you may become pregnant, take 400 to 800 micrograms (mcg) of folic acid every day.  If you want to prevent pregnancy, talk to your health care provider about birth control (contraception). OSTEOPOROSIS AND MENOPAUSE   Osteoporosis is a disease in which the bones lose minerals and strength with aging. This can result in serious bone fractures. Your risk for osteoporosis can be identified using a bone density scan.  If you are 25 years of age or older, or if you are at risk for osteoporosis and fractures, ask your health care provider if you should be screened.  Ask your health care provider whether you should take a calcium or vitamin D supplement to lower your risk for osteoporosis.  Menopause may have certain physical symptoms and risks.  Hormone replacement therapy may reduce some of these symptoms and risks. Talk to your health care provider about whether hormone replacement therapy is right for you.  HOME CARE INSTRUCTIONS   Schedule regular health, dental, and eye exams.  Stay current with your immunizations.   Do not use any tobacco products including cigarettes, chewing tobacco, or electronic cigarettes.  If you are pregnant, do not drink alcohol.  If you are breastfeeding, limit how much and how often you drink alcohol.  Limit alcohol intake to no more than 1 drink per day for nonpregnant women. One drink equals 12 ounces of beer, 5 ounces of wine, or 1 ounces of hard liquor.  Do  not use street drugs.  Do not share needles.  Ask your health care provider for help if you need support or information about quitting drugs.  Tell your health care provider if you often feel depressed.  Tell your health care provider if you have ever been abused or do not feel safe at home. Document Released: 02/15/2011 Document Revised: 12/17/2013 Document Reviewed: 07/04/2013 [facility] Patient Information 2015 [place], [place]. This information is not intended to replace advice given to you by your health care provider. Make sure you discuss any questions you have with your health care provider.

## 2015-11-13 NOTE — Progress Notes (Signed)
    Becky Aguilar 03-31-1971 [id number]        45 y.o.  G1P1001  for annual exam.  Several issues noted below.  Past medical history,surgical history, problem list, medications, allergies, family history and social history were all reviewed and documented as reviewed in the EPIC chart.  ROS:  Performed with pertinent positives and negatives included in the history, assessment and plan.   Additional significant findings :  none   Exam: [name] assistant Filed Vitals:   11/13/15 1141  BP: 116/74  Height: 5' 9"  (1.753 m)  Weight: 134 lb (60.782 kg)   General appearance:  Normal affect, orientation and appearance. Skin: Grossly normal HEENT: Without gross lesions.  No cervical or supraclavicular adenopathy. Thyroid normal.  Lungs:  Clear without wheezing, rales or rhonchi Cardiac: RR, without RMG Abdominal:  Soft, nontender, without masses, guarding, rebound, organomegaly or hernia Breasts:  Examined lying and sitting without masses, retractions, discharge or axillary adenopathy. Pelvic:  Ext/BUS/vagina with small raised hyperpigmented lesion mid lower mons pubis above the clitoral hood. Pap smear/HPV of vaginal cuff done  Adnexa without masses or tenderness    Anus and perineum normal   Rectovaginal normal sphincter tone without palpated masses or tenderness.  Physical Exam  Genitourinary:      Procedure: Skin surrounding the lesion was cleansed with Betadine, infiltrated with 1% lidocaine and the lesion was excised in its entirety to the level the surrounding skin. Silver nitrate hemostasis applied. The patient tolerated well.   Assessment/Plan:  45 y.o. G13P1001 female for annual exam.   1. Vulvar lesion. Patient notes new vulvar lesion as described above.  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 Patient will follow up for results. 2. Pap smear 2016 normal cytology but positive high risk HPV negative subtypes 16, 18/45. Pap smear/HPV done  today. 3. Status post TVH, posterior colporrhaphy for uterine prolapse and rectocele 2013. Started on Estrace 2 mg daily for hot flashes and night sweats. Is doing well on this and wants to continue. We have discussed the issues of ERT in the past to include stroke heart attack DVT and breast cancer. She is comfortable with this and refill 1 year provided. 4. Mammography 11/2014. Continue with annual mammography when due. SBE monthly reviewed. 5. Health maintenance. Patient reports routine blood work done at her primary physician's office. She does note a little bit of urgency and will go ahead and check a urinalysis today. No frequency dysuria low back pain or other UTI symptoms. If UA is negative then will follow for now if persists will represent for evaluation. If symptoms clear them will follow expectantly. If UTI then will treat accordingly. Follow up for pathology results, otherwise annual exam in one year   Anastasio Auerbach MD, 12:12 PM 11/13/2015

## 2015-11-13 NOTE — Addendum Note (Signed)
Addended by: Nelva Nay on: 11/13/2015 12:27 PM   Modules accepted: Orders

## 2015-11-14 ENCOUNTER — Telehealth: Payer: Self-pay | Admitting: *Deleted

## 2015-11-14 ENCOUNTER — Encounter: Payer: Self-pay | Admitting: Gynecology

## 2015-11-14 LAB — PAP IG AND HPV HIGH-RISK: HPV DNA High Risk: DETECTED — AB

## 2015-11-14 LAB — URINALYSIS W MICROSCOPIC + REFLEX CULTURE
BACTERIA UA: NONE SEEN [HPF]
BILIRUBIN URINE: NEGATIVE
CRYSTALS: NONE SEEN [HPF]
Casts: NONE SEEN [LPF]
Glucose, UA: NEGATIVE
Hgb urine dipstick: NEGATIVE
KETONES UR: NEGATIVE
LEUKOCYTES UA: NEGATIVE
Nitrite: NEGATIVE
PROTEIN: NEGATIVE
RBC / HPF: NONE SEEN RBC/HPF (ref <=2)
SQUAMOUS EPITHELIAL / LPF: NONE SEEN [HPF] (ref <=5)
Specific Gravity, Urine: 1.003 (ref 1.001–1.035)
WBC UA: NONE SEEN WBC/HPF (ref <=5)
Yeast: NONE SEEN [HPF]
pH: 6 (ref 5.0–8.0)

## 2015-11-14 LAB — PATHOLOGY

## 2015-11-14 NOTE — Telephone Encounter (Signed)
Pt called requesting urine results from [place] 11/13/15 pt informed results normal.

## 2016-01-09 ENCOUNTER — Other Ambulatory Visit: Payer: Self-pay

## 2016-01-09 DIAGNOSIS — Z1231 Encounter for screening mammogram for malignant neoplasm of breast: Secondary | ICD-10-CM

## 2016-01-21 ENCOUNTER — Ambulatory Visit (INDEPENDENT_AMBULATORY_CARE_PROVIDER_SITE_OTHER): Payer: Managed Care, Other (non HMO) | Admitting: Gynecology

## 2016-01-21 ENCOUNTER — Encounter: Payer: Self-pay | Admitting: Gynecology

## 2016-01-21 VITALS — BP 116/70

## 2016-01-21 DIAGNOSIS — R888 Abnormal findings in other body fluids and substances: Secondary | ICD-10-CM | POA: Diagnosis not present

## 2016-01-21 DIAGNOSIS — B977 Papillomavirus as the cause of diseases classified elsewhere: Secondary | ICD-10-CM

## 2016-01-21 DIAGNOSIS — IMO0002 Reserved for concepts with insufficient information to code with codable children: Secondary | ICD-10-CM

## 2016-01-21 NOTE — Patient Instructions (Signed)
Follow up March 2018 for annual exam, sooner if any issues.

## 2016-01-21 NOTE — Progress Notes (Signed)
    Becky Aguilar 1970/12/19 [id number]        45 y.o.  G1P1001 presents for colposcopy with Pap smear last year positive high-risk HPV, negative subtype 16, 18/45 and normal cytology. Repeat Pap smear this year again showed positive high-risk HPV with normal cytology. She also recently had a pigmented area excised from the mons pubis which showed superficial squamous epithelium but no other abnormalities.   Past medical history,surgical history, problem list, medications, allergies, family history and social history were all reviewed and documented in the EPIC chart.  Directed ROS with pertinent positives and negatives documented in the history of present illness/assessment and plan.  Exam:  Dillard Cannon Filed Vitals:   01/21/16 1456  BP: 116/70   General appearance:  NExternal BUS vagina is grossly normal. Prior biopsy site is totally normal with no residual abnormalities. Bimanual exam without masses or tenderness.  Colposcopy performed after acetic acid cleanse with no vaginal abnormalities noted.   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 Colposcopy is negative. Plan expectant management with follow up Pap smear next year at her annual exam.    Anastasio Auerbach MD, 3:14 PM 01/21/2016

## 2016-01-27 ENCOUNTER — Ambulatory Visit: Payer: Managed Care, Other (non HMO)

## 2016-09-20 ENCOUNTER — Encounter: Payer: Self-pay | Admitting: Gynecology

## 2016-09-20 ENCOUNTER — Ambulatory Visit (INDEPENDENT_AMBULATORY_CARE_PROVIDER_SITE_OTHER): Payer: Managed Care, Other (non HMO) | Admitting: Gynecology

## 2016-09-20 ENCOUNTER — Other Ambulatory Visit: Payer: Self-pay | Admitting: Gynecology

## 2016-09-20 VITALS — BP 116/70

## 2016-09-20 DIAGNOSIS — N939 Abnormal uterine and vaginal bleeding, unspecified: Secondary | ICD-10-CM

## 2016-09-20 MED ORDER — ESTROGENS, CONJUGATED 0.625 MG/GM VA CREA: 1.0000 | TOPICAL_CREAM | Freq: Every day | VAGINAL | 4 refills | Status: DC

## 2016-09-20 NOTE — Patient Instructions (Signed)
Start using the Premarin vaginal cream 3 times weekly. Follow up if the bleeding continues. Office will call you with the [facility] results.

## 2016-09-20 NOTE — Progress Notes (Signed)
    Becky Aguilar 06-21-1971 [id number]        46 y.o.  G1P1001 presents complaining of vaginal bleeding after intercourse. Has recently resumed intercourse and after each episode she's had some vaginal bleeding with discomfort deep in the pelvis. Does have a history of hot flushes and sweats although last [place] was normal in 2015. Status post TVH posterior colporrhaphy in the past.  Past medical history,surgical history, problem list, medications, allergies, family history and social history were all reviewed and documented in the EPIC chart.  Directed ROS with pertinent positives and negatives documented in the history of present illness/assessment and plan.  Exam: [name] assistant Vitals:   09/20/16 1357  BP: 116/70   General appearance:  Normal Abdomen soft nontender without masses guarding rebound Pelvic external BUS vagina with atrophic changes. Small abrasion at the posterior fourchette. Initial vaginal exam was normal. Swabbing of the upper vagina did produce some abrasive like using. Colposcopy subsequent performed which showed superficial abrasions but no lesions. Bimanual exam without masses or tenderness  Assessment/Plan:  46 y.o. G1P1001 with fragile vaginal mucosa leading to bleeding after intercourse. Will start with vaginal estrogen cream 3 times weekly. Check FSH. Possibilities for reinitiating oral estrogen supplementation discussed. Risks benefits to include the WHI study reviewed with stroke heart attack DVT and breast cancer discussed. Possible benefits if started early to include cardiovascular/bone health also discussed. Will readdress after [facility] returns. We'll start the Premarin vaginal cream regardless.    Anastasio Auerbach MD, 2:14 PM 09/20/2016     \

## 2016-09-20 NOTE — Addendum Note (Signed)
Addended by: Joaquin Music on: 09/20/2016 02:30 PM   Modules accepted: Orders

## 2016-11-14 DIAGNOSIS — R87610 Atypical squamous cells of undetermined significance on cytologic smear of cervix (ASC-US): Secondary | ICD-10-CM

## 2016-11-14 HISTORY — DX: Atypical squamous cells of undetermined significance on cytologic smear of cervix (ASC-US): R87.610

## 2016-11-16 ENCOUNTER — Other Ambulatory Visit: Payer: Self-pay | Admitting: Gynecology

## 2016-11-16 ENCOUNTER — Ambulatory Visit (INDEPENDENT_AMBULATORY_CARE_PROVIDER_SITE_OTHER): Payer: Managed Care, Other (non HMO) | Admitting: Gynecology

## 2016-11-16 ENCOUNTER — Encounter: Payer: Self-pay | Admitting: Gynecology

## 2016-11-16 VITALS — BP 116/74 | Ht 69.0 in | Wt 131.0 lb

## 2016-11-16 DIAGNOSIS — Z79890 Hormone replacement therapy (postmenopausal): Secondary | ICD-10-CM | POA: Diagnosis not present

## 2016-11-16 DIAGNOSIS — Z01419 Encounter for gynecological examination (general) (routine) without abnormal findings: Secondary | ICD-10-CM

## 2016-11-16 DIAGNOSIS — Z1151 Encounter for screening for human papillomavirus (HPV): Secondary | ICD-10-CM | POA: Diagnosis not present

## 2016-11-16 DIAGNOSIS — E28319 Asymptomatic premature menopause: Secondary | ICD-10-CM | POA: Diagnosis not present

## 2016-11-16 MED ORDER — ESTROGENS, CONJUGATED 0.625 MG/GM VA CREA: 1.0000 | TOPICAL_CREAM | Freq: Every day | VAGINAL | 4 refills | Status: DC

## 2016-11-16 MED ORDER — ESTRADIOL 2 MG PO TABS: 2.0000 mg | ORAL_TABLET | Freq: Every day | ORAL | 4 refills | Status: DC

## 2016-11-16 NOTE — Progress Notes (Signed)
    Becky Aguilar [birth year] [id number]        46 y.o.  G1P1001 for annual exam.    Past medical history,surgical history, problem list, medications, allergies, family history and social history were all reviewed and documented as reviewed in the EPIC chart.  ROS:  Performed with pertinent positives and negatives included in the history, assessment and plan.   Additional significant findings :  none   Exam: [name] assistant Vitals:   11/16/16 1538  BP: 116/74  Weight: 131 lb (59.4 kg)  Height: 5' 9"  (1.753 m)   Body mass index is 19.35 kg/m.  General appearance:  Normal affect, orientation and appearance. Skin: Grossly normal HEENT: Without gross lesions.  No cervical or supraclavicular adenopathy. Thyroid normal.  Lungs:  Clear without wheezing, rales or rhonchi Cardiac: RR, without RMG Abdominal:  Soft, nontender, without masses, guarding, rebound, organomegaly or hernia Breasts:  Examined lying and sitting without masses, retractions, discharge or axillary adenopathy. Pelvic:  Ext, BUS, Vagina: normal. Pap smear/HPV of vaginal cuff done  Adnexa: Without masses or tenderness    Anus and perineum: Normal   Rectovaginal: Normal sphincter tone without palpated masses or tenderness.    Assessment/Plan:  46 y.o. G71P1001 female for annual exam.   1.  Status post TVH, posterior colporrhaphy for uterine prolapse and rectocele 2013. Started on Estrace 2 mg daily for hot flushes and sweats. Doing well with this. Subsequently started on Premarin vaginal cream supplement has resolved. Patient wants to continue both regiments. We have discussed the risks to include thrombosis such as stroke heart attack DVT and breast cancer risks. Benefits from a symptom relief/cardiovascular/bone health. Refill 1 year for both provided. 2. Mammography2016. Patient knows she is overdue and agrees to call and schedule. SBE monthly reviewed. 3. Pap smear 2016 normal cytology was positive  high-risk HPV Follow up Pap smear 2017 with positive high-risk HPVwith normal cytology. Colposcopy was normal 2017.  Pap smear/HPV done today. 4. Health maintenance. Patient prefers to have routine blood work done through her primary physician's office.  Follow up for Pap smear results, follow up for mammogram, follow up in one year for annual exam.   Anastasio Auerbach MD, 4:21 PM 11/16/2016

## 2016-11-16 NOTE — Addendum Note (Signed)
Addended by: Nelva Nay on: 11/16/2016 04:46 PM   Modules accepted: Orders

## 2016-11-16 NOTE — Patient Instructions (Signed)

## 2016-11-19 LAB — PAP IG AND HPV HIGH-RISK: HPV DNA HIGH RISK: DETECTED — AB

## 2016-11-24 ENCOUNTER — Encounter: Payer: Self-pay | Admitting: Gynecology

## 2016-11-24 LAB — HPV TYPE 16 AND 18/45 RNA
HPV TYPE 18/45 RNA: NOT DETECTED
HPV Type 16 RNA: NOT DETECTED

## 2016-11-30 ENCOUNTER — Ambulatory Visit (INDEPENDENT_AMBULATORY_CARE_PROVIDER_SITE_OTHER): Payer: Managed Care, Other (non HMO) | Admitting: Gynecology

## 2016-11-30 ENCOUNTER — Encounter: Payer: Self-pay | Admitting: Gynecology

## 2016-11-30 VITALS — BP 118/76

## 2016-11-30 DIAGNOSIS — R87620 Atypical squamous cells of undetermined significance on cytologic smear of vagina (ASC-US): Secondary | ICD-10-CM

## 2016-11-30 DIAGNOSIS — Z23 Encounter for immunization: Secondary | ICD-10-CM

## 2016-11-30 DIAGNOSIS — R87811 Vaginal high risk human papillomavirus (HPV) DNA test positive: Secondary | ICD-10-CM | POA: Diagnosis not present

## 2016-11-30 NOTE — Patient Instructions (Signed)
Follow up in 2 months for your next Gardasil vaccination.

## 2016-11-30 NOTE — Addendum Note (Signed)
Addended by: Nelva Nay on: 11/30/2016 11:29 AM   Modules accepted: Orders

## 2016-11-30 NOTE — Progress Notes (Signed)
    Becky Aguilar Oct 05, 1970 [id number]        46 y.o.  G1P1001 presents for colposcopy. She has a history of positive high-risk HPV with normal cytology 2016. Repeat Pap smear 2017 again showed positive high-risk HPV with normal cytology. Colposcopy at that time was normal. Most recent Pap smear showed ASCUS with positive high-risk HPV negative subtype 16, 18/45.  Past medical history,surgical history, problem list, medications, allergies, family history and social history were all reviewed and documented in the EPIC chart.  Directed ROS with pertinent positives and negatives documented in the history of present illness/assessment and plan.  Exam: [name] assistant Vitals:   11/30/16 1049  BP: 118/76   General appearance:  Normal Abdomen soft nontender without masses guarding rebound Pelvic external BUS vagina without visual or palpable abnormalities. Bimanual exam without masses or tenderness  Colposcopy performed after acetic acid cleanse was normal with no abnormalities seen.  Assessment/Plan:  46 y.o. G1P1001 with persistent HPV and most recent Pap smear showing ASCUS. Colposcopy is normal. Recommend follow up Pap smear/HPV in one year. The patient asked me about receiving the Gardasil vaccine. I reviewed with her the recommended age limits although certainly with new partners she could be at risk of being exposed to different subtypes in the future. She understands that the vaccine probably does not alter the course of her current infection and only protects her against the subtypes within the vaccine. After a lengthy discussion the patient wants to go ahead and initiate the series and received her first Gardasil 9 today. She'll return in 2 months for her next booster  And ultimately 6 months for her last.   Anastasio Auerbach MD, 11:12 AM 11/30/2016

## 2016-12-08 ENCOUNTER — Other Ambulatory Visit: Payer: Self-pay

## 2016-12-08 NOTE — Telephone Encounter (Signed)
Pharmacy sent refill request for Gardasil 9. I called patient to see what it was about. She said she had pharmacist run claim to see if it was less expensive to get it there. She said can get Rx there and they will administer vaccine for $0 copayment?  Ok to order?  If so, if dose the same for last two injections where I would just put a refill on this?

## 2016-12-09 NOTE — Telephone Encounter (Signed)
[place] with one refill

## 2016-12-10 MED ORDER — HPV 9-VALENT RECOMB VACCINE IM SUSP: 1.0000 | Freq: Once | INTRAMUSCULAR | 1 refills | Status: AC

## 2016-12-10 NOTE — Telephone Encounter (Signed)
Rx sent 

## 2016-12-21 ENCOUNTER — Other Ambulatory Visit: Payer: Self-pay | Admitting: Gynecology

## 2016-12-21 DIAGNOSIS — Z1231 Encounter for screening mammogram for malignant neoplasm of breast: Secondary | ICD-10-CM

## 2017-01-05 ENCOUNTER — Ambulatory Visit
Admission: RE | Admit: 2017-01-05 | Discharge: 2017-01-05 | Disposition: A | Discharge status: Home / Self Care | Payer: Managed Care, Other (non HMO) | Source: Ambulatory Visit | Attending: Gynecology | Admitting: Gynecology

## 2017-01-05 DIAGNOSIS — Z1231 Encounter for screening mammogram for malignant neoplasm of breast: Secondary | ICD-10-CM

## 2017-01-25 ENCOUNTER — Ambulatory Visit: Payer: Managed Care, Other (non HMO)

## 2017-03-31 ENCOUNTER — Ambulatory Visit (INDEPENDENT_AMBULATORY_CARE_PROVIDER_SITE_OTHER): Payer: Managed Care, Other (non HMO) | Admitting: Gynecology

## 2017-03-31 DIAGNOSIS — Z23 Encounter for immunization: Secondary | ICD-10-CM

## 2017-04-20 ENCOUNTER — Other Ambulatory Visit: Payer: Self-pay | Admitting: Gynecology

## 2017-08-19 ENCOUNTER — Ambulatory Visit: Payer: Managed Care, Other (non HMO)

## 2017-08-22 ENCOUNTER — Ambulatory Visit (INDEPENDENT_AMBULATORY_CARE_PROVIDER_SITE_OTHER): Payer: Managed Care, Other (non HMO) | Admitting: Gynecology

## 2017-08-22 DIAGNOSIS — Z23 Encounter for immunization: Secondary | ICD-10-CM

## 2017-11-18 ENCOUNTER — Encounter: Payer: Self-pay | Admitting: Gynecology

## 2017-11-18 ENCOUNTER — Encounter: Payer: Managed Care, Other (non HMO) | Admitting: Gynecology

## 2017-11-18 ENCOUNTER — Ambulatory Visit (INDEPENDENT_AMBULATORY_CARE_PROVIDER_SITE_OTHER): Payer: Managed Care, Other (non HMO) | Admitting: Gynecology

## 2017-11-18 VITALS — BP 116/76 | Ht 70.0 in | Wt 141.0 lb

## 2017-11-18 DIAGNOSIS — Z01419 Encounter for gynecological examination (general) (routine) without abnormal findings: Secondary | ICD-10-CM

## 2017-11-18 DIAGNOSIS — Z1151 Encounter for screening for human papillomavirus (HPV): Secondary | ICD-10-CM

## 2017-11-18 DIAGNOSIS — N951 Menopausal and female climacteric states: Secondary | ICD-10-CM

## 2017-11-18 DIAGNOSIS — N952 Postmenopausal atrophic vaginitis: Secondary | ICD-10-CM | POA: Diagnosis not present

## 2017-11-18 MED ORDER — ESTROGENS, CONJUGATED 0.625 MG/GM VA CREA: TOPICAL_CREAM | VAGINAL | 2 refills | Status: DC

## 2017-11-18 NOTE — Patient Instructions (Signed)
Office will call with lab test results.

## 2017-11-18 NOTE — Addendum Note (Signed)
Addended by: Nelva Nay on: 11/18/2017 04:48 PM   Modules accepted: Orders

## 2017-11-18 NOTE — Progress Notes (Signed)
    Becky Aguilar Sep 22, 1970 [id number]        47 y.o.  G1P1001 for annual gynecologic exam.  History of Estrace 2 mg daily for hot flushes and sweats and Premarin vaginal cream for vaginal dryness.  She has stopped the Estrace.  She is having some hot flushes and sweats but at this point was not interested in continuing.  She has not been using the Premarin vaginal cream consistently but would like to continue this.  She never had an elevated FSH but was started on the estrogen replacement based on symptoms which seemed to help  Past medical history,surgical history, problem list, medications, allergies, family history and social history were all reviewed and documented as reviewed in the EPIC chart.  ROS:  Performed with pertinent positives and negatives included in the history, assessment and plan.   Additional significant findings : None   Exam: [name] assistant Vitals:   11/18/17 1603  BP: 116/76  Weight: 141 lb (64 kg)  Height: 5' 10"  (1.778 m)   Body mass index is 20.23 kg/m.  General appearance:  Normal affect, orientation and appearance. Skin: Grossly normal HEENT: Without gross lesions.  No cervical or supraclavicular adenopathy. Thyroid normal.  Lungs:  Clear without wheezing, rales or rhonchi Cardiac: RR, without RMG Abdominal:  Soft, nontender, without masses, guarding, rebound, organomegaly or hernia Breasts:  Examined lying and sitting without masses, retractions, discharge or axillary adenopathy. Pelvic:   Ext, BUS, Vagina: With mild atrophic changes.  Pap smear/HPV  Adnexa: Without masses or tenderness    Anus and perineum: Normal   Rectovaginal: Normal sphincter tone without palpated masses or tenderness.    Assessment/Plan:  47 y.o. G25P1001 female for annual gynecologic exam status post TVH and posterior colporrhaphy for uterine prolapse and rectocele 2013.   1. Menopausal symptoms.  [place] in the past have been normal but was started on Estrace for  symptoms which seem to improve on the estrogen replacement.  She has subsequently discontinued this.  Also using Premarin vaginal cream for vaginal support but has not been using consistently.  I reviewed the issues of HRT and benefits if indeed she had undergone premature menopause as far as cardiovascular and bone health are concerned.  Recommend we recheck [place] and TSH to rule out thyroid dysfunction now.  If [place] normal and she prefers to stay off estrogen then she will continue as is.  If [place] elevated then the issue as to whether she would like to restart the estrogen not only for symptom relief but for cardiovascular and bone health will be discussed.  She does want to continue on Premarin for now and will continue with twice weekly applications.  Refill provided. 2. 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  Repeat 2017 showed positive high risk HPV with normal cytology.  Colposcopy at that time was normal.  Pap smear last year was ASCUS positive high risk HPV negative subtype 16, 18/45.  Colposcopy was adequate and normal last year.  Pap smear/HPV done today. 3. Mammography 12/2016.  I reminded her to schedule that coming up.  Breast exam normal today. 4. Health maintenance.  No routine blood work done as patient does this elsewhere.  Follow-up for lab work and Pap smear results.  Follow-up in 1 year for annual exam.   Anastasio Auerbach MD, 4:32 PM 11/18/2017

## 2017-11-22 ENCOUNTER — Encounter: Payer: Self-pay | Admitting: Gynecology

## 2017-11-22 LAB — PAP IG AND HPV HIGH-RISK: HPV DNA High Risk: DETECTED — AB

## 2017-12-15 ENCOUNTER — Other Ambulatory Visit: Payer: Self-pay | Admitting: Gynecology

## 2017-12-15 DIAGNOSIS — Z1231 Encounter for screening mammogram for malignant neoplasm of breast: Secondary | ICD-10-CM

## 2018-01-06 ENCOUNTER — Ambulatory Visit
Admission: RE | Admit: 2018-01-06 | Discharge: 2018-01-06 | Disposition: A | Discharge status: Home / Self Care | Payer: Managed Care, Other (non HMO) | Source: Ambulatory Visit | Attending: Gynecology | Admitting: Gynecology

## 2018-01-06 DIAGNOSIS — Z1231 Encounter for screening mammogram for malignant neoplasm of breast: Secondary | ICD-10-CM

## 2018-05-16 DIAGNOSIS — R87612 Low grade squamous intraepithelial lesion on cytologic smear of cervix (LGSIL): Secondary | ICD-10-CM

## 2018-05-16 HISTORY — DX: Low grade squamous intraepithelial lesion on cytologic smear of cervix (LGSIL): R87.612

## 2018-05-31 ENCOUNTER — Ambulatory Visit: Payer: Managed Care, Other (non HMO) | Admitting: Gynecology

## 2018-05-31 ENCOUNTER — Encounter: Payer: Self-pay | Admitting: Gynecology

## 2018-05-31 VITALS — BP 118/76

## 2018-05-31 DIAGNOSIS — R87810 Cervical high risk human papillomavirus (HPV) DNA test positive: Secondary | ICD-10-CM

## 2018-05-31 DIAGNOSIS — R87610 Atypical squamous cells of undetermined significance on cytologic smear of cervix (ASC-US): Secondary | ICD-10-CM | POA: Diagnosis not present

## 2018-05-31 NOTE — Progress Notes (Signed)
    Becky Aguilar September 19, 1970 [id number]        47 y.o.  G1P1001 follows up for Pap smear.  History of high risk HPV with normal cytology 2016 and 2017.  Colposcopy was normal.  Pap smear 2018 showed ASCUS with positive high risk HPV negative subtype 16, 18/45.  Her last Pap smear 11/2017 showed ASCUS positive high risk HPV and she presents for a six-month follow-up Pap smear.  Past medical history,surgical history, problem list, medications, allergies, family history and social history were all reviewed and documented in the EPIC chart.  Directed ROS with pertinent positives and negatives documented in the history of present illness/assessment and plan.  Exam: Becky Aguilar assistant Vitals:   05/31/18 1422  BP: 118/76   General appearance:  Normal Abdomen soft nontender without masses guarding rebound Pelvic external BUS vagina normal.  Pap smear of vaginal cuff done.  Bimanual exam without masses or tenderness.  Assessment/Plan:  47 y.o. G1P1001 with a history of persistent low-grade atypia and positive high risk HPV.  She will follow-up for Pap smear results.  Discussed possible need for follow-up colposcopy.  Will triage based upon results.    Becky Auerbach MD, 2:44 PM 05/31/2018

## 2018-05-31 NOTE — Addendum Note (Signed)
Addended by: Nelva Nay on: 05/31/2018 03:03 PM   Modules accepted: Orders

## 2018-05-31 NOTE — Patient Instructions (Signed)
Office will call you with your Pap smear results

## 2018-06-02 ENCOUNTER — Encounter: Payer: Self-pay | Admitting: Gynecology

## 2018-06-02 LAB — PAP IG AND HPV HIGH-RISK: HPV DNA HIGH RISK: DETECTED — AB

## 2018-06-07 NOTE — Telephone Encounter (Signed)
Appointment has been scheduled for January 8 at 10:00 with Dr Phineas Real.

## 2018-07-07 ENCOUNTER — Ambulatory Visit: Payer: Managed Care, Other (non HMO) | Admitting: Gynecology

## 2018-07-21 ENCOUNTER — Encounter: Payer: Self-pay | Admitting: Gynecology

## 2018-07-21 ENCOUNTER — Ambulatory Visit: Payer: Managed Care, Other (non HMO) | Admitting: Gynecology

## 2018-07-21 VITALS — BP 122/82

## 2018-07-21 DIAGNOSIS — R87612 Low grade squamous intraepithelial lesion on cytologic smear of cervix (LGSIL): Secondary | ICD-10-CM | POA: Diagnosis not present

## 2018-07-21 DIAGNOSIS — R87810 Cervical high risk human papillomavirus (HPV) DNA test positive: Secondary | ICD-10-CM

## 2018-07-21 NOTE — Progress Notes (Signed)
    Becky Aguilar [birth year] [id number]        47 y.o.  G1P1001 presents for colposcopy with a long history of persistent low-grade abnormal Pap smears with positive high risk HPV.  History of high risk HPV with normal cytology 2016 and 2017. Colposcopy was normal. Pap smear 2018 showed ASCUS with positive high risk HPV negative subtype 16, 18/45. Pap smear 11/2017 showed ASCUS positive high risk HPV and her most recent Pap smear 05/2018 showed LGSIL with positive high risk HPV.   Past medical history,surgical history, problem list, medications, allergies, family history and social history were all reviewed and documented in the EPIC chart.  Directed ROS with pertinent positives and negatives documented in the history of present illness/assessment and plan.  Exam: Becky Aguilar assistant Vitals:   07/21/18 1142  BP: 122/82   General appearance:  Normal Abdomen soft nontender without masses guarding rebound Pelvic external BUS vagina normal.  Bimanual exam without masses or tenderness.  Colposcopy performed after acetic acid cleanse along the entire vagina with no abnormality seen.  No biopsies taken.  Assessment/Plan:  47 y.o. G1P1001 with persistent low-grade Pap smears and positive high risk HPV.  We discussed expectant management for now with follow-up Pap smear this coming summer when due for her annual exam.  As long as they remain low-grade then we will plan to follow.  The issues as to how often we do colposcopy was also discussed and we will readdress this on an ongoing fashion as needed.    Anastasio Auerbach MD, 12:06 PM 07/21/2018

## 2018-07-21 NOTE — Patient Instructions (Signed)
Follow-up this coming summer for annual exam when due.

## 2018-08-23 ENCOUNTER — Ambulatory Visit: Payer: Managed Care, Other (non HMO) | Admitting: Gynecology

## 2018-11-21 ENCOUNTER — Encounter: Payer: Managed Care, Other (non HMO) | Admitting: Gynecology

## 2019-01-11 LAB — BASIC METABOLIC PANEL
BUN: 7 (ref 4–21)
CO2: 27 — AB (ref 13–22)
Chloride: 106 (ref 99–108)
Creatinine: 0.5 (ref 0.5–1.1)
Glucose: 75
Potassium: 4.1 (ref 3.4–5.3)
Sodium: 142 (ref 137–147)

## 2019-01-11 LAB — VITAMIN B12: Vitamin B-12: 547

## 2019-01-11 LAB — CBC AND DIFFERENTIAL
HCT: 39 (ref 36–46)
Hemoglobin: 12.8 (ref 12.0–16.0)
Platelets: 218 (ref 150–399)
WBC: 3.2

## 2019-01-11 LAB — COMPREHENSIVE METABOLIC PANEL
Albumin: 3.4 — AB (ref 3.5–5.0)
GFR calc Af Amer: 172
GFR calc non Af Amer: 143

## 2019-01-11 LAB — HEPATIC FUNCTION PANEL
ALT: 33 (ref 7–35)
AST: 23 (ref 13–35)
Alkaline Phosphatase: 53 (ref 25–125)
Bilirubin, Total: 0.4

## 2019-01-11 LAB — CBC: RBC: 4.18 (ref 3.87–5.11)

## 2019-01-11 LAB — TSH: TSH: 0 — AB (ref 0.41–5.90)

## 2019-05-08 ENCOUNTER — Encounter: Payer: Self-pay | Admitting: Gynecology

## 2019-09-20 ENCOUNTER — Ambulatory Visit: Payer: Managed Care, Other (non HMO) | Attending: Internal Medicine

## 2019-09-20 DIAGNOSIS — Z20822 Contact with and (suspected) exposure to covid-19: Secondary | ICD-10-CM

## 2019-09-21 LAB — NOVEL CORONAVIRUS, NAA: SARS-CoV-2, NAA: NOT DETECTED

## 2020-01-29 LAB — HM MAMMOGRAPHY

## 2020-02-28 IMAGING — MG DIGITAL SCREENING BILATERAL MAMMOGRAM WITH TOMO AND CAD
8 series · 9 of 24 positions shown · non-contrast
Comparison: Previous exam(s).

CLINICAL DATA: Screening.

EXAM:
DIGITAL SCREENING BILATERAL MAMMOGRAM WITH TOMO AND CAD

[L MLO synth-2D]
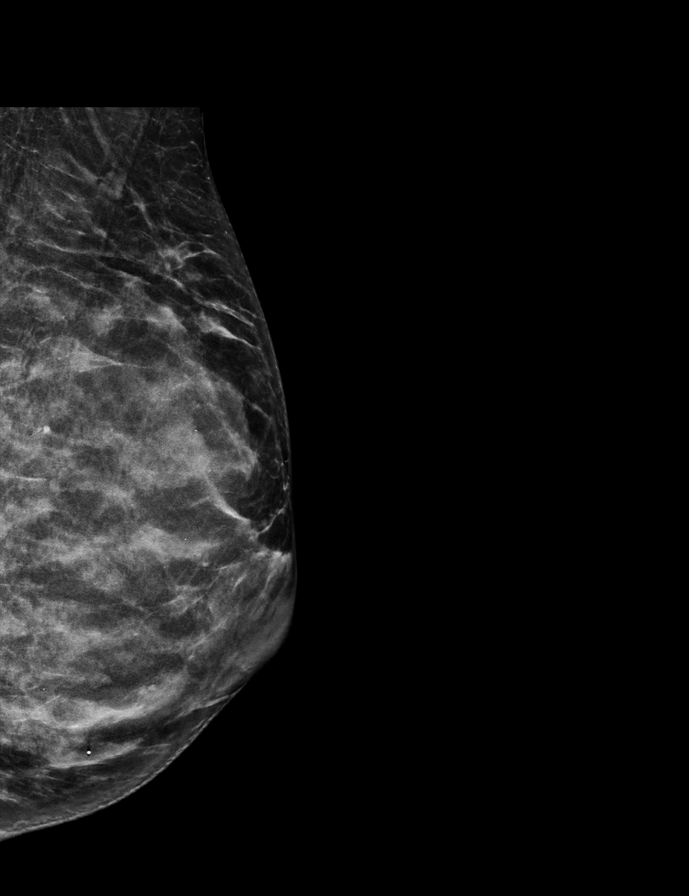

[L CC synth-2D]
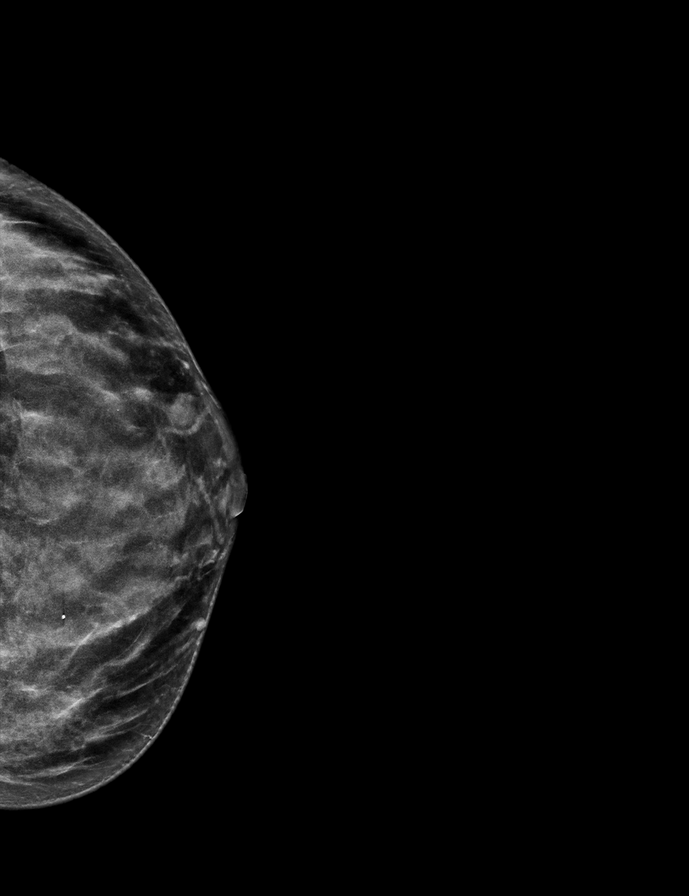

[R CC synth-2D]
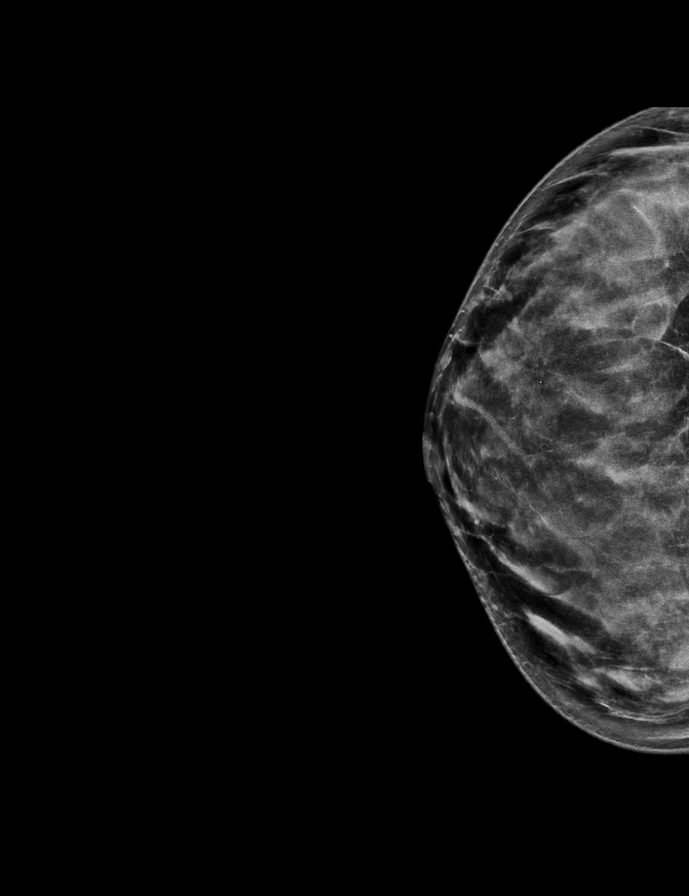

[R MLO synth-2D]
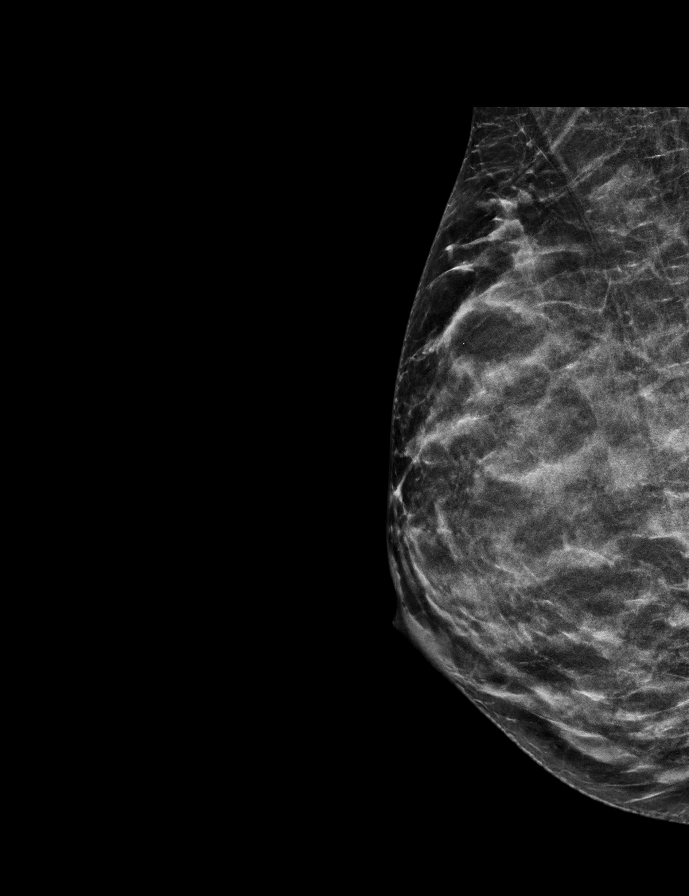

[L CC tomo · 2 of 54 frames shown]
[frame 18/54]
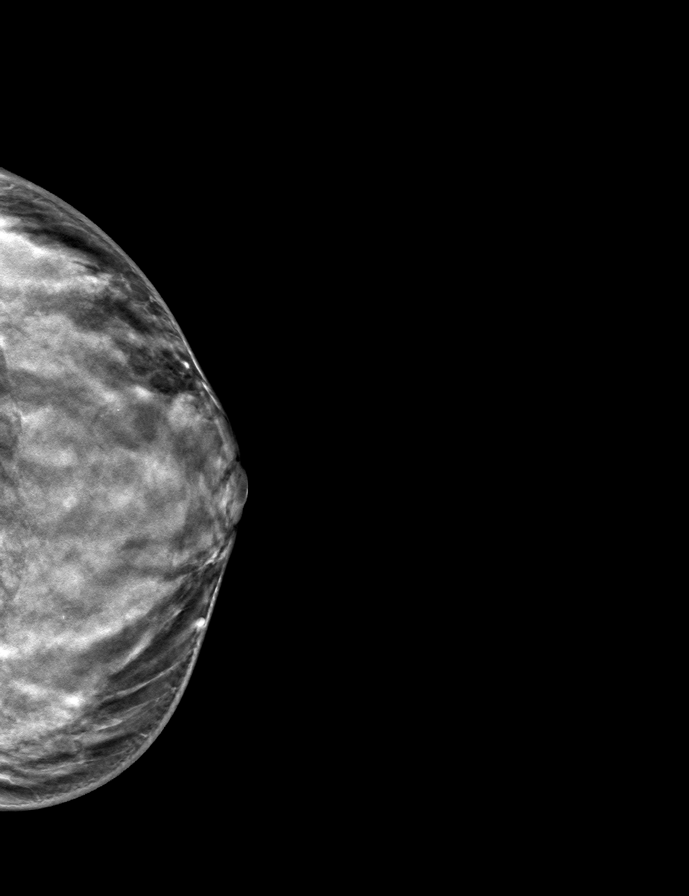
[frame 27/54]
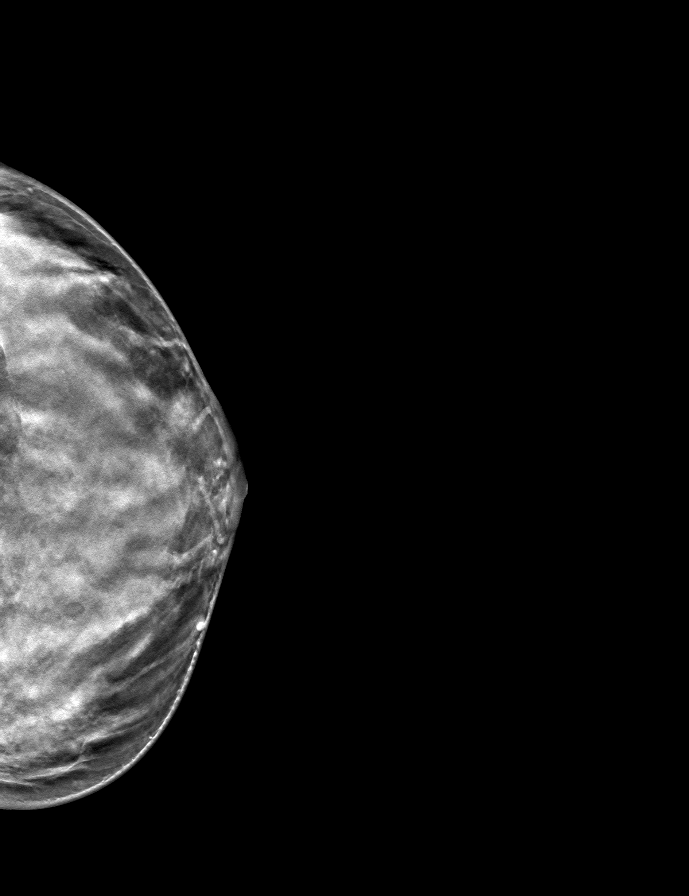

[R CC tomo · tomo slice 28/55.0]
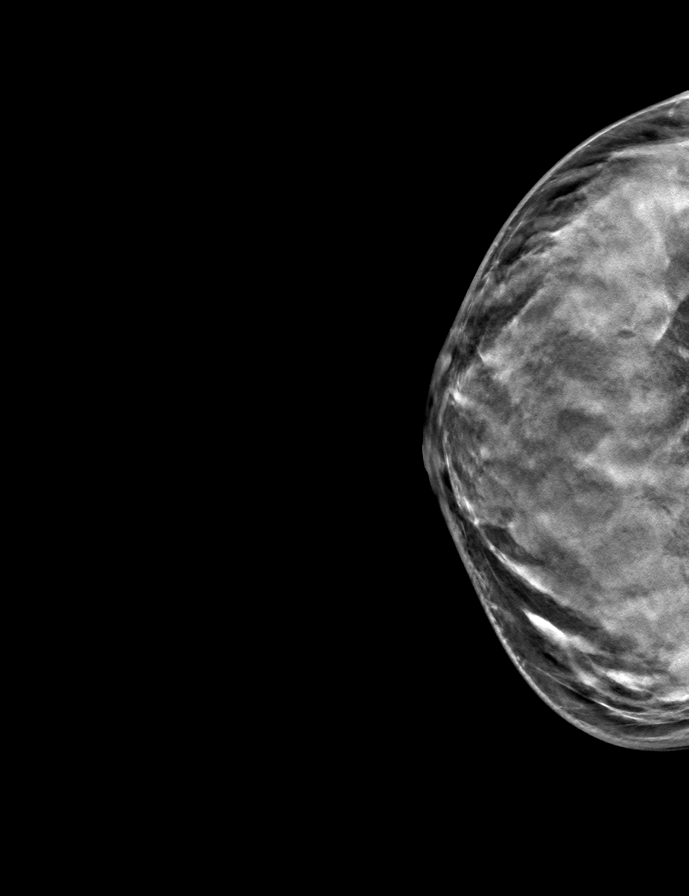

[R MLO tomo · tomo slice 25/48.0]
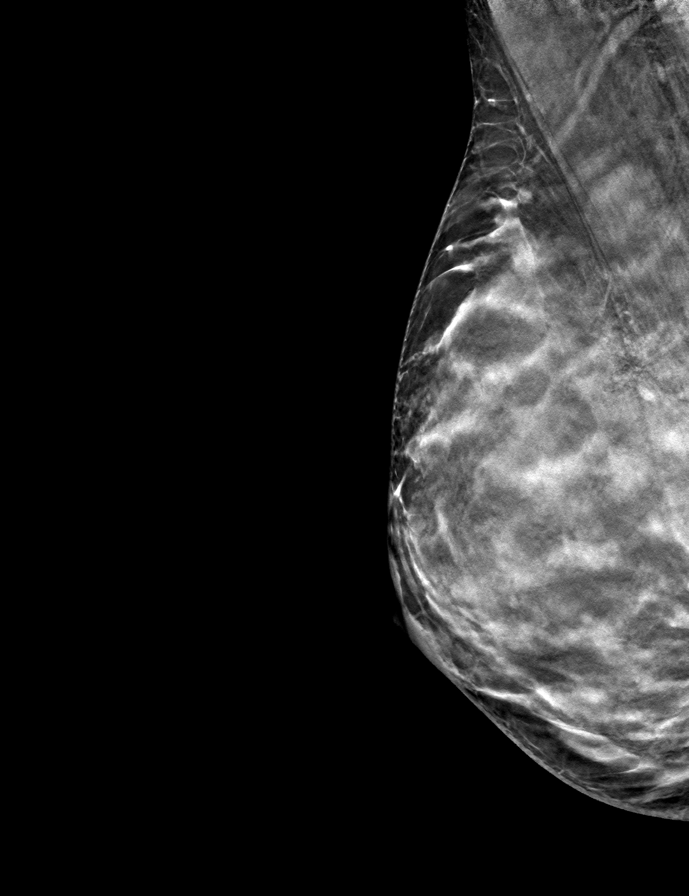

[L MLO tomo · tomo slice 27/53.0]
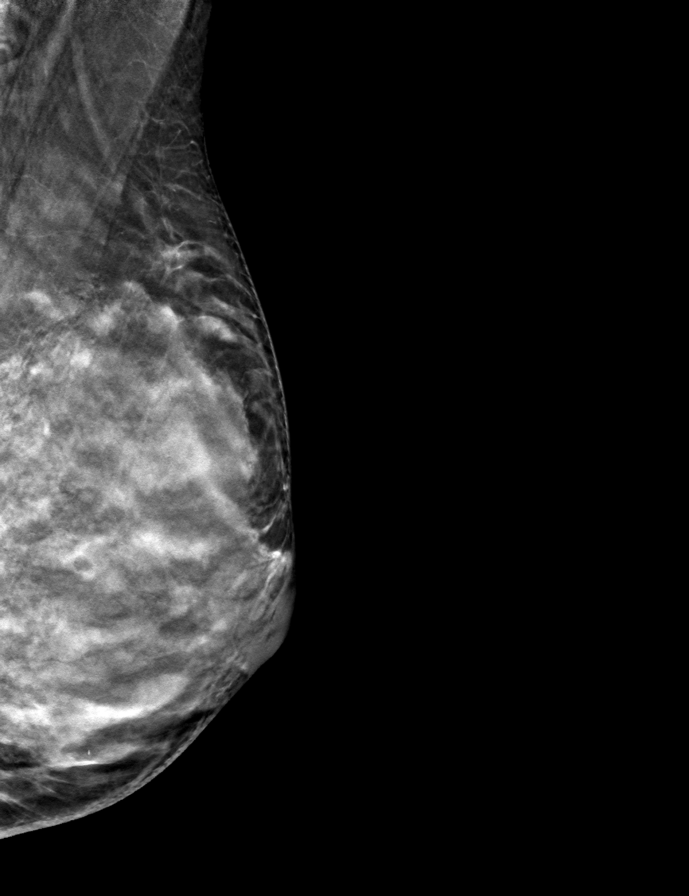

[9 of 24 positions shown; findings below may reference images not displayed]

ACR Breast Density Category d: The breast tissue is extremely dense,
which lowers the sensitivity of mammography.
FINDINGS: There are no findings suspicious for malignancy. Images were
processed with CAD.
IMPRESSION: No mammographic evidence of malignancy. A result letter of this
screening mammogram will be mailed directly to the patient.

RECOMMENDATION:
Screening mammogram in one year. (Code:RA-[id number])

BI-RADS CATEGORY  1: Negative.

## 2020-07-03 ENCOUNTER — Encounter: Payer: Self-pay | Admitting: Gastroenterology

## 2020-07-09 ENCOUNTER — Ambulatory Visit: Payer: Managed Care, Other (non HMO) | Admitting: Podiatry

## 2020-07-28 ENCOUNTER — Other Ambulatory Visit: Payer: Self-pay

## 2020-07-28 ENCOUNTER — Ambulatory Visit (AMBULATORY_SURGERY_CENTER): Payer: Self-pay | Admitting: *Deleted

## 2020-07-28 VITALS — Ht 69.0 in | Wt 137.0 lb

## 2020-07-28 DIAGNOSIS — Z1211 Encounter for screening for malignant neoplasm of colon: Secondary | ICD-10-CM

## 2020-07-28 MED ORDER — SUTAB 1479-225-188 MG PO TABS: 24.0000 | ORAL_TABLET | ORAL | 0 refills | Status: DC

## 2020-07-28 NOTE — Progress Notes (Signed)
No egg or soy allergy known to patient  No issues with past sedation with any surgeries or procedures No intubation problems in the past  No FH of Malignant Hyperthermia No diet pills per patient No home 02 use per patient  No blood thinners per patient  Pt denies issues with constipation  No A fib or A flutter  EMMI video to pt or via [place] 19 guidelines implemented in PV today with Pt and RN  Pt is fully vaccinated  for [employer] given to pt in PV today , Code to Pharmacy   Due to the COVID-19 pandemic we are asking patients to follow certain guidelines.  Pt aware of COVID protocols and [facility] guidelines

## 2020-08-13 ENCOUNTER — Encounter: Payer: Self-pay | Admitting: Gastroenterology

## 2020-08-18 ENCOUNTER — Telehealth: Payer: Self-pay | Admitting: Gastroenterology

## 2020-08-18 NOTE — Telephone Encounter (Signed)
Good morning Dr. Havery Moros, patient called to cancel her procedure for 08/21/20.  States she is concerned about covid exposure and prefers to call back a later date to reschedule.

## 2020-08-18 NOTE — Telephone Encounter (Signed)
Okay thanks for letting me know

## 2020-08-21 ENCOUNTER — Encounter: Payer: Managed Care, Other (non HMO) | Admitting: Gastroenterology

## 2020-10-28 ENCOUNTER — Encounter: Payer: Self-pay | Admitting: *Deleted

## 2020-11-17 ENCOUNTER — Ambulatory Visit (INDEPENDENT_AMBULATORY_CARE_PROVIDER_SITE_OTHER): Payer: Managed Care, Other (non HMO) | Admitting: Family Medicine

## 2020-11-17 ENCOUNTER — Encounter: Payer: Self-pay | Admitting: Family Medicine

## 2020-11-17 ENCOUNTER — Other Ambulatory Visit: Payer: Self-pay

## 2020-11-17 VITALS — BP 122/70 | HR 62 | Temp 98.3°F | Resp 16 | Ht 69.0 in | Wt 140.2 lb

## 2020-11-17 DIAGNOSIS — T7840XA Allergy, unspecified, initial encounter: Secondary | ICD-10-CM

## 2020-11-17 DIAGNOSIS — F419 Anxiety disorder, unspecified: Secondary | ICD-10-CM | POA: Diagnosis not present

## 2020-11-17 DIAGNOSIS — E0500 Thyrotoxicosis with diffuse goiter without thyrotoxic crisis or storm: Secondary | ICD-10-CM | POA: Diagnosis not present

## 2020-11-17 DIAGNOSIS — G43809 Other migraine, not intractable, without status migrainosus: Secondary | ICD-10-CM

## 2020-11-17 DIAGNOSIS — Z1211 Encounter for screening for malignant neoplasm of colon: Secondary | ICD-10-CM

## 2020-11-17 DIAGNOSIS — G43909 Migraine, unspecified, not intractable, without status migrainosus: Secondary | ICD-10-CM | POA: Insufficient documentation

## 2020-11-17 LAB — TSH: TSH: 0.79 u[IU]/mL (ref 0.35–4.50)

## 2020-11-17 MED ORDER — PROPRANOLOL HCL ER 60 MG PO CP24: 60.0000 mg | ORAL_CAPSULE | Freq: Every day | ORAL | 3 refills | Status: DC

## 2020-11-17 MED ORDER — MONTELUKAST SODIUM 10 MG PO TABS: 10.0000 mg | ORAL_TABLET | Freq: Every day | ORAL | 3 refills | Status: DC

## 2020-11-17 MED ORDER — PAROXETINE HCL 10 MG PO TABS: 10.0000 mg | ORAL_TABLET | Freq: Every day | ORAL | 3 refills | Status: DC

## 2020-11-17 MED ORDER — SUMATRIPTAN SUCCINATE 50 MG PO TABS: 50.0000 mg | ORAL_TABLET | ORAL | 5 refills | Status: DC | PRN

## 2020-11-17 NOTE — Progress Notes (Signed)
Becky Aguilar is a 50 y.o. female who presents today for an office visit.  Assessment/Plan:  Chronic Problems Addressed Today: Graves disease She has been on methimazole in the past.  Not on anything currently.  Will check TSH today.  Anxiety Stable on Paxil 10 mg daily.  Will refill today.  Allergies On Singulair 10 mg daily.  Occasionally uses albuterol as needed.  Will refill Singulair.  She is also using over-the-counter meds  Migraine She is on propranolol 80 mg daily though is having some side effects with this.  Will decrease dose to 60 mg daily.  Also refill Imitrex 50 mg daily as needed.  She does have some Norco on hand that she will use as needed for severe migraines though does not need refill today.  She will check with me in a few weeks via MyChart to let me know how she is doing with lower dose of propranolol.  Preventative Healthcare We will order Cologuard.  Gets Pap smear through GYN.    Subjective:  HPI:  See a/p for status of chronic conditions.  PMH:  The following were reviewed and entered/updated in epic: Past Medical History:  Diagnosis Date  . Allergy   . Anemia   . Anxiety   . ASCUS with positive high risk HPV cervical 11/2016   Negative subtype 16, 18/45  . Asthma   . Graves' disease in remission   . Headache(784.0)   . HPV test positive 10/2014, 10/2015, 11/2017   Subtype 16, 18/45 negative in 2018.   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 VIN (vulval intraepithelial neoplasia) I 2006   Patient Active Problem List   Diagnosis Date Noted  . Migraine 11/17/2020  . Allergies 11/17/2020  . Anxiety 11/17/2020  . Graves disease 11/17/2020   Past Surgical History:  Procedure Laterality Date  . RECTOCELE REPAIR  10/18/2011   Procedure: POSTERIOR REPAIR (RECTOCELE)  . VAGINAL HYSTERECTOMY  10/18/2011   Procedure: HYSTERECTOMY VAGINAL for uterine prolapse  . WISDOM TOOTH EXTRACTION      Family History  Problem  Relation Age of Onset  . Cancer Father        prostate and brain  . Prostate cancer Father   . Brain cancer Father   . High Cholesterol Father   . Cancer Maternal Grandmother        Liver  . Liver cancer Maternal Grandmother        primary site Colon ., mets to liver   . Colon cancer Maternal Grandmother   . Breast cancer Paternal Grandmother        Age unknown  . Cancer Daughter        kidney  . Kidney cancer Daughter   . Colon polyps Brother   . Colon cancer Maternal Aunt   . Cancer Mother   . Heart disease Mother   . High Cholesterol Mother   . Esophageal cancer Neg Hx   . Rectal cancer Neg Hx   . Stomach cancer Neg Hx     Medications- reviewed and updated Current Outpatient Medications  Medication Sig Dispense Refill  . albuterol (PROVENTIL) (2.5 MG/3ML) 0.083% nebulizer solution Take 2.5 mg by nebulization every 6 (six) hours as needed for wheezing or shortness of breath.    Marland Kitchen albuterol (VENTOLIN HFA) 108 (90 Base) MCG/ACT inhaler Inhale into the lungs every 6 (six) hours as needed for wheezing or shortness of breath.    Marland Kitchen aspirin-acetaminophen-caffeine (EXCEDRIN MIGRAINE) 250-250-65 MG per  tablet Take 1 tablet by mouth daily as needed. For headache    . conjugated estrogens (PREMARIN) vaginal cream INSERT ONE APPLICATORFUL VAGINALLY TWICE WEEKLY 42.5 g 2  . montelukast (SINGULAIR) 10 MG tablet Take 1 tablet (10 mg total) by mouth at bedtime. 90 tablet 3  . Multiple Vitamin (MULTIVITAMIN) tablet Take 1 tablet by mouth daily.    . propranolol ER (INDERAL LA) 60 MG 24 hr capsule Take 1 capsule (60 mg total) by mouth daily. 90 capsule 3  . Salicylic Acid (PROPA PH ACNE MED CLEANSING EX) Apply topically.    . Sodium Sulfate-Mag Sulfate-KCl (SUTAB) [phone] MG TABS Take 24 tablets by mouth as directed. MANUFACTURER CODES!! BIN: [id number] PCN: CN GROUP: [id number] MEMBER ID: [id number];GUY AS SECONDARY INSURANCE ;NO PRIOR AUTHORIZATION 24 tablet 0  . PARoxetine (PAXIL) 10 MG  tablet Take 1 tablet (10 mg total) by mouth daily. 90 tablet 3  . SUMAtriptan (IMITREX) 50 MG tablet Take 1 tablet (50 mg total) by mouth every 2 (two) hours as needed. May repeat in 2 hours if headache persists or recurs. 10 tablet 5   No current facility-administered medications for this visit.    Allergies-reviewed and updated Allergies  Allergen Reactions  . Doxycycline Rash    Social History   Socioeconomic History  . Marital status: Divorced    Spouse name: Not on file  . Number of children: Not on file  . Years of education: Not on file  . Highest education level: Not on file  Occupational History  . Not on file  Tobacco Use  . Smoking status: Never Smoker  . Smokeless tobacco: Never Used  Vaping Use  . Vaping Use: Never used  Substance and Sexual Activity  . Alcohol use: Yes    Alcohol/week: 0.0 standard drinks    Comment: rare  . Drug use: No  . Sexual activity: Not Currently    Birth control/protection: Surgical    Comment: HYST-1st intercourse 50 yo-Fewer than 5 partners  Other Topics Concern  . Not on file  Social History Narrative  . Not on file   Social Determinants of Health   Financial Resource Strain: Not on file  Food Insecurity: Not on file  Transportation Needs: Not on file  Physical Activity: Not on file  Stress: Not on file  Social Connections: Not on file           Objective:  Physical Exam: BP 122/70   Pulse 62   Temp 98.3 F (36.8 C) (Temporal)   Resp 16   Ht 5' 9"  (1.753 m)   Wt 140 lb 3.2 oz (63.6 kg)   LMP 10/06/2011 (Exact Date)   SpO2 99%   BMI 20.70 kg/m   Gen: No acute distress, resting comfortably CV: Regular rate and rhythm with no murmurs appreciated Pulm: Normal work of breathing, clear to auscultation bilaterally with no crackles, wheezes, or rhonchi Neuro: Grossly normal, moves all extremities Psych: Normal affect and thought content      Moise Friday M. Jerline Pain, MD 11/17/2020 2:00 PM

## 2020-11-17 NOTE — Assessment & Plan Note (Signed)
She has been on methimazole in the past.  Not on anything currently.  Will check TSH today.

## 2020-11-17 NOTE — Assessment & Plan Note (Signed)
She is on propranolol 80 mg daily though is having some side effects with this.  Will decrease dose to 60 mg daily.  Also refill Imitrex 50 mg daily as needed.  She does have some Norco on hand that she will use as needed for severe migraines though does not need refill today.  She will check with me in a few weeks via MyChart to let me know how she is doing with lower dose of propranolol.

## 2020-11-17 NOTE — Assessment & Plan Note (Signed)
Stable on Paxil 10 mg daily.  Will refill today.

## 2020-11-17 NOTE — Assessment & Plan Note (Signed)
On Singulair 10 mg daily.  Occasionally uses albuterol as needed.  Will refill Singulair.  She is also using over-the-counter meds

## 2020-11-17 NOTE — Patient Instructions (Addendum)
It was very nice to see you today!  We will check blood work today.  We will send in a new dose of propranolol 60 mg daily.  Please let me know how this works for you.  I will also place an order for Cologuard.  I will see back in year for your next annual checkup.  Please come back to see me sooner if needed.  Take care, Dr Jerline Pain  PLEASE NOTE:  If you had any lab tests please let us know if you have not heard back within a few days. You may see your results on mychart before we have a chance to review them but we will give you a call once they are reviewed by [place]. If we ordered any referrals today, please let us know if you have not heard from their office within the next week.   Please try these tips to maintain a healthy lifestyle:   Eat at least 3 REAL meals and 1-2 snacks per day.  Aim for no more than 5 hours between eating.  If you eat breakfast, please do so within one hour of getting up.    Each meal should contain half fruits/vegetables, one quarter protein, and one quarter carbs (no bigger than a computer mouse)   Cut down on sweet beverages. This includes juice, soda, and sweet tea.     Drink at least 1 glass of water with each meal and aim for at least 8 glasses per day   Exercise at least 150 minutes every week.   2

## 2020-11-18 ENCOUNTER — Telehealth: Payer: Self-pay

## 2020-11-18 NOTE — Telephone Encounter (Signed)
Pt returned call. She said please leave voicemail with the info on it if she doesn't answer.

## 2020-11-18 NOTE — Telephone Encounter (Signed)
Spoke with patient about labs, gave a verbal understanding.

## 2020-11-18 NOTE — Progress Notes (Signed)
Please inform patient of the following:  TSH level is normal. Do not need to make any change to her treatment plan at this time. We can recheck in a year.  Algis Greenhouse. Jerline Pain, MD 11/18/2020 12:51 PM

## 2020-11-26 ENCOUNTER — Encounter: Payer: Self-pay | Admitting: Family Medicine

## 2020-12-13 LAB — COLOGUARD: Cologuard: NEGATIVE

## 2020-12-15 ENCOUNTER — Encounter: Payer: Self-pay | Admitting: Family Medicine

## 2021-04-19 ENCOUNTER — Other Ambulatory Visit: Payer: Self-pay | Admitting: Family Medicine

## 2021-08-13 ENCOUNTER — Other Ambulatory Visit: Payer: Self-pay | Admitting: Family Medicine

## 2021-10-05 ENCOUNTER — Telehealth: Payer: Self-pay | Admitting: Family Medicine

## 2021-10-05 NOTE — Telephone Encounter (Signed)
Tested + for covid on 10/02/21- would like covid meds to please be called in -

## 2021-10-05 NOTE — Telephone Encounter (Signed)
Called and pt states she does not need an appt.

## 2021-10-05 NOTE — Telephone Encounter (Signed)
Patient need OV with any open provider   Dr Jerline Pain see note

## 2021-10-05 NOTE — Telephone Encounter (Signed)
Yes she needs an appointment.  Algis Greenhouse. Jerline Pain, MD 10/05/2021 3:10 PM

## 2021-10-05 NOTE — Telephone Encounter (Signed)
Patient called this morning - needs meds called in- message sent to Dr Ellwood Handler team.   Patient Name: Becky Aguilar Gender: Female DOB: July 14, 1971 Age: 51 Y 11 M 16 D Return Phone Number: [phone] (Primary) Address: City/ State/ Zip: [place]  [address] Client [facility] Client [facility] Night Provider Dimas Chyle- MD Contact Type Call Who Is Calling Patient / Member / Family / Caregiver Call Type Triage / Clinical Relationship To Patient Self Return Phone Number [phone] (Primary) Chief Complaint Sore Throat Reason for Call Symptomatic / Request for Health Information Initial Comment Caller has Covid, requesting Paxlovid, c/o cough and sore throat. Translation No Nurse Assessment Nurse: Aram Candela, RN, Brandin Date/Time (Eastern Time): 10/03/2021 9:10:43 AM Confirm and document reason for call. If symptomatic, describe symptoms. ---Caller has Covid tested positive last pm, c/o cough and sore throat, no fever Does the patient have any new or worsening symptoms? ---Yes Will a triage be completed? ---Yes Related visit to physician within the last 2 weeks? ---No Does the PT have any chronic conditions? (i.e. diabetes, asthma, this includes High risk factors for pregnancy, etc.) ---Yes List chronic conditions. ---asthma, graves disease Is the patient pregnant or possibly pregnant? (Ask all females between the ages of 34-55) ---No Is this a behavioral health or substance abuse call? ---No Guidelines Guideline Title Affirmed Question Affirmed Notes Nurse Date/Time (Eastern Time) COVID-19 - Diagnosed or Suspected [1] COVID-19 infection suspected by caller or triager AND [2] mild symptoms (cough, fever, or others) Aram Candela, Therapist, sports, Brandin 10/03/2021 9:13:04 AM PLEASE NOTE: All timestamps contained within this report are represented as [place] Standard Time. CONFIDENTIALTY NOTICE: This  fax transmission is intended only for the addressee. It contains information that is legally privileged, confidential or otherwise protected from use or disclosure. If you are not the intended recipient, you are strictly prohibited from reviewing, disclosing, copying using or disseminating any of this information or taking any action in reliance on or regarding this information. If you have received this fax in error, please notify us immediately by telephone so that we can arrange for its return to [place]. Phone: [phone], Toll-Free: [phone], Fax: [phone] Page: 2 of 2 Call Id: [id number] Guidelines Guideline Title Affirmed Question Affirmed Notes Nurse Date/Time Eilene Ghazi Time) AND [3] negative COVID-19 rapid test Disp. Time Eilene Ghazi Time) Disposition Final User 10/03/2021 9:18:36 AM Call PCP when Office is Open Yes Aram Candela, RN, Brandin Caller Disagree/Comply Comply Caller Understands Yes PreDisposition Did not know what to do Care Advice Given Per Guideline CALL PCP WHEN OFFICE IS OPEN: * You need to discuss this with your doctor (or NP/PA) within the next few days. * Call the office when it is open. HUMIDIFIER: * If the air is dry, use a humidifier in the bedroom. * Dry air makes coughs worse. COUGHING SPELLS: * Drink warm fluids. Inhale warm mist. This can help relax the airway and also loosen up phlegm. * STAY HOME A MINIMUM OF 5 DAYS: People with MILD COVID-19 can STOP HOME ISOLATION AFTER 5 DAYS if (1) fever has been gone for 24 hours (without using fever medicine) AND (2) symptoms are better. Continue to wear a well-fitted mask for a full 10 days when around others. * WEAR A MASK FOR 10 DAYS: Wear a well-fitted mask for 10 full days any time you are around others inside your home or in public. Do not go to places where you are unable to wear a  mask. CALL BACK IF: * Fever over 103 F (39.4 C) * Fever lasts over 3 days * Fever returns after being gone for 24 hours * Chest pain or  difficulty breathing occurs * You become worse CARE ADVICE given per COVID-19 - DIAGNOSED OR SUSPECTED (Adult) guideline. Referrals REFERRED TO PCP OFFICE

## 2021-10-05 NOTE — Telephone Encounter (Signed)
Spoke with patient, advise need to be seen in order to prescribed mediation  Patient sated she is ok and want her daughter to be seen first  Advise if symptoms not improving to schedule an appointment of virtual UC in [place]

## 2021-10-05 NOTE — Telephone Encounter (Signed)
Need virtual OV with any available provider for Covid medication

## 2021-11-06 ENCOUNTER — Ambulatory Visit (INDEPENDENT_AMBULATORY_CARE_PROVIDER_SITE_OTHER): Payer: Managed Care, Other (non HMO) | Admitting: Family Medicine

## 2021-11-06 VITALS — BP 111/76 | HR 94 | Temp 98.2°F | Ht 69.0 in | Wt 135.2 lb

## 2021-11-06 DIAGNOSIS — G43809 Other migraine, not intractable, without status migrainosus: Secondary | ICD-10-CM

## 2021-11-06 DIAGNOSIS — T7840XA Allergy, unspecified, initial encounter: Secondary | ICD-10-CM

## 2021-11-06 DIAGNOSIS — Z13220 Encounter for screening for lipoid disorders: Secondary | ICD-10-CM

## 2021-11-06 DIAGNOSIS — E0500 Thyrotoxicosis with diffuse goiter without thyrotoxic crisis or storm: Secondary | ICD-10-CM | POA: Diagnosis not present

## 2021-11-06 DIAGNOSIS — F419 Anxiety disorder, unspecified: Secondary | ICD-10-CM | POA: Diagnosis not present

## 2021-11-06 DIAGNOSIS — Z0001 Encounter for general adult medical examination with abnormal findings: Secondary | ICD-10-CM | POA: Diagnosis not present

## 2021-11-06 LAB — LIPID PANEL
Cholesterol: 188 mg/dL (ref 0–200)
HDL: 61.2 mg/dL (ref >39.00)
LDL Cholesterol: 113 mg/dL — ABNORMAL HIGH (ref 0–99)
NonHDL: 126.44
Total CHOL/HDL Ratio: 3
Triglycerides: 66 mg/dL (ref 0.0–149.0)
VLDL: 13.2 mg/dL (ref 0.0–40.0)

## 2021-11-06 LAB — CBC
HCT: 39.4 % (ref 36.0–46.0)
Hemoglobin: 13.3 g/dL (ref 12.0–15.0)
MCHC: 33.6 g/dL (ref 30.0–36.0)
MCV: 95.1 fl (ref 78.0–100.0)
Platelets: 218 10*3/uL (ref 150.0–400.0)
RBC: 4.15 Mil/uL (ref 3.87–5.11)
RDW: 13.6 % (ref 11.5–15.5)
WBC: 5.5 10*3/uL (ref 4.0–10.5)

## 2021-11-06 LAB — COMPREHENSIVE METABOLIC PANEL
ALT: 13 U/L (ref 0–35)
AST: 20 U/L (ref 0–37)
Albumin: 4.5 g/dL (ref 3.5–5.2)
Alkaline Phosphatase: 44 U/L (ref 39–117)
BUN: 11 mg/dL (ref 6–23)
CO2: 27 mEq/L (ref 19–32)
Calcium: 9.4 mg/dL (ref 8.4–10.5)
Chloride: 105 mEq/L (ref 96–112)
Creatinine, Ser: 0.65 mg/dL (ref 0.40–1.20)
GFR: 103.65 mL/min (ref >60.00)
Glucose, Bld: 79 mg/dL (ref 70–99)
Potassium: 3.4 mEq/L — ABNORMAL LOW (ref 3.5–5.1)
Sodium: 139 mEq/L (ref 135–145)
Total Bilirubin: 0.6 mg/dL (ref 0.2–1.2)
Total Protein: 7 g/dL (ref 6.0–8.3)

## 2021-11-06 NOTE — Assessment & Plan Note (Signed)
Stable on Paxil 10 mg daily. ?

## 2021-11-06 NOTE — Assessment & Plan Note (Signed)
Doing well on propranolol 60 mg daily and Imitrex 50 mg as needed.  ?

## 2021-11-06 NOTE — Patient Instructions (Signed)
It was very nice to see you today! ? ?We will check blood work today  ? ?Keep up the good work with diet and exercise. ? ?We will see back next physical.  Back to see [place] sooner if needed. ? ?Take care, ?Dr Jerline Pain ? ?PLEASE NOTE: ? ?If you had any lab tests please let us know if you have not heard back within a few days. You may see your results on mychart before we have a chance to review them but we will give you a call once they are reviewed by [place]. If we ordered any referrals today, please let us know if you have not heard from their office within the next week.  ? ?Please try these tips to maintain a healthy lifestyle: ? ?Eat at least 3 REAL meals and 1-2 snacks per day.  Aim for no more than 5 hours between eating.  If you eat breakfast, please do so within one hour of getting up.  ? ?Each meal should contain half fruits/vegetables, one quarter protein, and one quarter carbs (no bigger than a computer mouse) ? ?Cut down on sweet beverages. This includes juice, soda, and sweet tea.  ? ?Drink at least 1 glass of water with each meal and aim for at least 8 glasses per day ? ?Exercise at least 150 minutes every week.   ? ?Preventive Care 64-2 Years Old, Female ?Preventive care refers to lifestyle choices and visits with your health care provider that can promote health and wellness. Preventive care visits are also called wellness exams. ?What can I expect for my preventive care visit? ?Counseling ?Your health care provider may ask you questions about your: ?Medical history, including: ?Past medical problems. ?Family medical history. ?Pregnancy history. ?Current health, including: ?Menstrual cycle. ?Method of birth control. ?Emotional well-being. ?Home life and relationship well-being. ?Sexual activity and sexual health. ?Lifestyle, including: ?Alcohol, nicotine or tobacco, and drug use. ?Access to firearms. ?Diet, exercise, and sleep habits. ?Work and work Statistician. ?Sunscreen use. ?Safety issues such as  seatbelt and bike helmet use. ?Physical exam ?Your health care provider will check your: ?Height and weight. These may be used to calculate your BMI (body mass index). BMI is a measurement that tells if you are at a healthy weight. ?Waist circumference. This measures the distance around your waistline. This measurement also tells if you are at a healthy weight and may help predict your risk of certain diseases, such as type 2 diabetes and high blood pressure. ?Heart rate and blood pressure. ?Body temperature. ?Skin for abnormal spots. ?What immunizations do I need? ?Vaccines are usually given at various ages, according to a schedule. Your health care provider will recommend vaccines for you based on your age, medical history, and lifestyle or other factors, such as travel or where you work. ?What tests do I need? ?Screening ?Your health care provider may recommend screening tests for certain conditions. This may include: ?Lipid and cholesterol levels. ?Diabetes screening. This is done by checking your blood sugar (glucose) after you have not eaten for a while (fasting). ?Pelvic exam and Pap test. ?Hepatitis B test. ?Hepatitis C test. ?HIV (human immunodeficiency virus) test. ?STI (sexually transmitted infection) testing, if you are at risk. ?Lung cancer screening. ?Colorectal cancer screening. ?Mammogram. Talk with your health care provider about when you should start having regular mammograms. This may depend on whether you have a family history of breast cancer. ?BRCA-related cancer screening. This may be done if you have a family history of breast, ovarian, tubal,  or peritoneal cancers. ?Bone density scan. This is done to screen for osteoporosis. ?Talk with your health care provider about your test results, treatment options, and if necessary, the need for more tests. ?Follow these instructions at home: ?Eating and drinking ? ?Eat a diet that includes fresh fruits and vegetables, whole grains, lean protein, and  low-fat dairy products. ?Take vitamin and mineral supplements as recommended by your health care provider. ?Do not drink alcohol if: ?Your health care provider tells you not to drink. ?You are pregnant, may be pregnant, or are planning to become pregnant. ?If you drink alcohol: ?Limit how much you have to 0-1 drink a day. ?Know how much alcohol is in your drink. In the [place] one drink equals one 12 oz bottle of beer (355 mL), one 5 oz glass of wine (148 mL), or one 1? oz glass of hard liquor (44 mL). ?Lifestyle ?Brush your teeth every morning and night with fluoride toothpaste. Floss one time each day. ?Exercise for at least 30 minutes 5 or more days each week. ?Do not use any products that contain nicotine or tobacco. These products include cigarettes, chewing tobacco, and vaping devices, such as e-cigarettes. If you need help quitting, ask your health care provider. ?Do not use drugs. ?If you are sexually active, practice safe sex. Use a condom or other form of protection to prevent STIs. ?If you do not wish to become pregnant, use a form of birth control. If you plan to become pregnant, see your health care provider for a prepregnancy visit. ?Take aspirin only as told by your health care provider. Make sure that you understand how much to take and what form to take. Work with your health care provider to find out whether it is safe and beneficial for you to take aspirin daily. ?Find healthy ways to manage stress, such as: ?Meditation, yoga, or listening to music. ?Journaling. ?Talking to a trusted person. ?Spending time with friends and family. ?Minimize exposure to UV radiation to reduce your risk of skin cancer. ?Safety ?Always wear your seat belt while driving or riding in a vehicle. ?Do not drive: ?If you have been drinking alcohol. Do not ride with someone who has been drinking. ?When you are tired or distracted. ?While texting. ?If you have been using any mind-altering substances or drugs. ?Wear a helmet  and other protective equipment during sports activities. ?If you have firearms in your house, make sure you follow all gun safety procedures. ?Seek help if you have been physically or sexually abused. ?What's next? ?Visit your health care provider once a year for an annual wellness visit. ?Ask your health care provider how often you should have your eyes and teeth checked. ?Stay up to date on all vaccines. ?This information is not intended to replace advice given to you by your health care provider. Make sure you discuss any questions you have with your health care provider. ?Document Revised: 01/28/2021 Document Reviewed: 01/28/2021 ?Elsevier Patient Education ? [employer]. ? ?

## 2021-11-06 NOTE — Progress Notes (Signed)
? ?Chief Complaint:  ?Becky Aguilar is a 51 y.o. female who presents today for her annual comprehensive physical exam.   ? ?Assessment/Plan:  ?Chronic Problems Addressed Today: ?Graves disease ?Plan currently on any medications.  Has been on methimazole in the past.  Check TSH. ? ?Anxiety ?Stable on Paxil 10 mg daily. ? ?Allergies ?On Singulair 10 mg daily and albuterol as needed.  Doing well. ? ?Migraine ?Doing well on propranolol 60 mg daily and Imitrex 50 mg as needed.  ? ?Pruritis  ?No red flags.  Recommended topical steroids or capsaicin as needed. ? ?Preventative Healthcare: ?Check labs.  Goes to GYN for Pap.  Up-to-date on vaccines. ? ?Patient Counseling(The following topics were reviewed and/or handout was given): ? -Nutrition: Stressed importance of moderation in sodium/caffeine intake, saturated fat and cholesterol, caloric balance, sufficient intake of fresh fruits, vegetables, and fiber. ? -Stressed the importance of regular exercise.  ? -Substance Abuse: Discussed cessation/primary prevention of tobacco, alcohol, or other drug use; driving or other dangerous activities under the influence; availability of treatment for abuse.  ? -Injury prevention: Discussed safety belts, safety helmets, smoke detector, smoking near bedding or upholstery.  ? -Sexuality: Discussed sexually transmitted diseases, partner selection, use of condoms, avoidance of unintended pregnancy and contraceptive alternatives.  ? -Dental health: Discussed importance of regular tooth brushing, flossing, and dental visits. ? -Health maintenance and immunizations reviewed. Please refer to Health maintenance section. ? ?Return to care in 1 year for next preventative visit.  ? ?  ?Subjective:  ?HPI: ? ?She has no acute complaints today.  ? ?She injured the tip of her finger several months ago.  The wound is healed dose of occasional itching of the area.  No redness. ? ?Lifestyle ?Diet: Balanced. Plenty of fruits and vegetables.   ?Exercise: Likes walking.  ? ? ?  11/17/2020  ?  1:10 PM  ?Depression screen PHQ 2/9  ?Decreased Interest 0  ?Down, Depressed, Hopeless 0  ?PHQ - 2 Score 0  ? ? ?Health Maintenance Due  ?Topic Date Due  ? PAP SMEAR-Modifier  05/31/2021  ? COVID-19 Vaccine (5 - Booster for Moderna series) 06/20/2021  ?  ? ?ROS: Per HPI, otherwise a complete review of systems was negative.  ? ?PMH: ? ?The following were reviewed and entered/updated in epic: ?Past Medical History:  ?Diagnosis Date  ? Allergy   ? Anemia   ? Anxiety   ? ASCUS with positive high risk HPV cervical 11/2016  ? Negative subtype 16, 18/45  ? Asthma   ? Graves' disease in remission   ? Headache(784.0)   ? HPV test positive 10/2014, 10/2015, 11/2017  ? Subtype 16, 18/45 negative in 2018.   ? 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 VIN (vulval intraepithelial neoplasia) I 2006  ? ?Patient Active Problem List  ? Diagnosis Date Noted  ? Migraine 11/17/2020  ? Allergies 11/17/2020  ? Anxiety 11/17/2020  ? Graves disease 11/17/2020  ? ?Past Surgical History:  ?Procedure Laterality Date  ? RECTOCELE REPAIR  10/18/2011  ? Procedure: POSTERIOR REPAIR (RECTOCELE)  ? VAGINAL HYSTERECTOMY  10/18/2011  ? Procedure: HYSTERECTOMY VAGINAL for uterine prolapse  ? WISDOM TOOTH EXTRACTION    ? ? ?Family History  ?Problem Relation Age of Onset  ? Cancer Father   ?     prostate and brain  ? Prostate cancer Father   ? Brain cancer Father   ? High Cholesterol Father   ? Cancer Maternal Grandmother   ?  Liver  ? Liver cancer Maternal Grandmother   ?     primary site Colon ., mets to liver   ? Colon cancer Maternal Grandmother   ? Breast cancer Paternal Grandmother   ?     Age unknown  ? Cancer Daughter   ?     kidney  ? Kidney cancer Daughter   ? Colon polyps Brother   ? Colon cancer Maternal Aunt   ? Cancer Mother   ? Heart disease Mother   ? High Cholesterol Mother   ? Esophageal cancer Neg Hx   ? Rectal cancer Neg Hx   ? Stomach cancer Neg Hx   ? ? ?Medications- reviewed  and updated ?Current Outpatient Medications  ?Medication Sig Dispense Refill  ? albuterol (PROVENTIL) (2.5 MG/3ML) 0.083% nebulizer solution Take 2.5 mg by nebulization every 6 (six) hours as needed for wheezing or shortness of breath.    ? albuterol (VENTOLIN HFA) 108 (90 Base) MCG/ACT inhaler Inhale into the lungs every 6 (six) hours as needed for wheezing or shortness of breath.    ? aspirin-acetaminophen-caffeine (EXCEDRIN MIGRAINE) 250-250-65 MG per tablet Take 1 tablet by mouth daily as needed. For headache    ? conjugated estrogens (PREMARIN) vaginal cream INSERT ONE APPLICATORFUL VAGINALLY TWICE WEEKLY 42.5 g 2  ? montelukast (SINGULAIR) 10 MG tablet Take 1 tablet (10 mg total) by mouth at bedtime. 90 tablet 3  ? Multiple Vitamin (MULTIVITAMIN) tablet Take 1 tablet by mouth daily.    ? PARoxetine (PAXIL) 10 MG tablet Take 1 tablet (10 mg total) by mouth daily. 90 tablet 3  ? propranolol ER (INDERAL LA) 60 MG 24 hr capsule Take 1 capsule (60 mg total) by mouth daily. 90 capsule 3  ? Salicylic Acid (PROPA PH ACNE MED CLEANSING EX) Apply topically.    ? SUMAtriptan (IMITREX) 50 MG tablet TAKE ONE TABLET BY MOUTH AT ONSET OF HEADACHE; MAY REPEAT ONE TABLET IN 2 HOURS IF NEEDED. 10 tablet 5  ? ?No current facility-administered medications for this visit.  ? ? ?Allergies-reviewed and updated ?Allergies  ?Allergen Reactions  ? Doxycycline Rash  ? ? ?Social History  ? ?Socioeconomic History  ? Marital status: Divorced  ?  Spouse name: Not on file  ? Number of children: Not on file  ? Years of education: Not on file  ? Highest education level: Not on file  ?Occupational History  ? Not on file  ?Tobacco Use  ? Smoking status: Never  ? Smokeless tobacco: Never  ?Vaping Use  ? Vaping Use: Never used  ?Substance and Sexual Activity  ? Alcohol use: Yes  ?  Alcohol/week: 0.0 standard drinks  ?  Comment: rare  ? Drug use: No  ? Sexual activity: Not Currently  ?  Birth control/protection: Surgical  ?  Comment: HYST-1st  intercourse 51 yo-Fewer than 5 partners  ?Other Topics Concern  ? Not on file  ?Social History Narrative  ? Not on file  ? ?Social Determinants of Health  ? ?Financial Resource Strain: Not on file  ?Food Insecurity: Not on file  ?Transportation Needs: Not on file  ?Physical Activity: Not on file  ?Stress: Not on file  ?Social Connections: Not on file  ? ?   ?  ?Objective:  ?Physical Exam: ?BP 111/76 (BP Location: Right Arm)   Pulse 94   Temp 98.2 ?F (36.8 ?C) (Temporal)   Ht 5' 9"  (1.753 m)   Wt 135 lb 3.2 oz (61.3 kg)   LMP 10/06/2011 (  Exact Date)   SpO2 98%   BMI 19.97 kg/m?   ?Body mass index is 19.97 kg/m?. ?Wt Readings from Last 3 Encounters:  ?11/06/21 135 lb 3.2 oz (61.3 kg)  ?11/17/20 140 lb 3.2 oz (63.6 kg)  ?07/28/20 137 lb (62.1 kg)  ? ?Gen: NAD, resting comfortably ?HEENT: TMs normal bilaterally. OP clear. No thyromegaly noted.  ?CV: RRR with no murmurs appreciated ?Pulm: NWOB, CTAB with no crackles, wheezes, or rhonchi ?GI: Normal bowel sounds present. Soft, Nontender, Nondistended. ?MSK: no edema, cyanosis, or clubbing noted ?Skin: warm, dry ?Neuro: CN2-12 grossly intact. Strength 5/5 in upper and lower extremities. Reflexes symmetric and intact bilaterally.  ?Psych: Normal affect and thought content ?   ? ?Algis Greenhouse. Jerline Pain, MD ?11/06/2021 2:05 PM  ?

## 2021-11-06 NOTE — Assessment & Plan Note (Signed)
On Singulair 10 mg daily and albuterol as needed.  Doing well. ?

## 2021-11-06 NOTE — Assessment & Plan Note (Signed)
Plan currently on any medications.  Has been on methimazole in the past.  Check TSH. ?

## 2021-11-10 ENCOUNTER — Encounter: Payer: Self-pay | Admitting: Family Medicine

## 2021-11-10 DIAGNOSIS — E785 Hyperlipidemia, unspecified: Secondary | ICD-10-CM | POA: Insufficient documentation

## 2021-11-10 LAB — TSH: TSH: 0.73 u[IU]/mL (ref 0.35–5.50)

## 2021-11-10 NOTE — Progress Notes (Signed)
Please inform patient of the following: ? ?Her LDL is up a bit but everything else is stable. Do not need to make any changes to her treatment plan at this time. She should continue working on diet and exercise and we can recheck in a year or so. ? ?Algis Greenhouse. Jerline Pain, MD ?11/10/2021 9:38 AM  ?

## 2021-11-14 ENCOUNTER — Other Ambulatory Visit: Payer: Self-pay | Admitting: Family Medicine

## 2021-12-07 ENCOUNTER — Other Ambulatory Visit: Payer: Self-pay | Admitting: Family Medicine

## 2021-12-14 ENCOUNTER — Encounter: Payer: Self-pay | Admitting: Family Medicine

## 2022-02-19 ENCOUNTER — Other Ambulatory Visit: Payer: Self-pay | Admitting: *Deleted

## 2022-02-19 ENCOUNTER — Other Ambulatory Visit: Payer: Self-pay | Admitting: Family Medicine

## 2022-04-04 ENCOUNTER — Other Ambulatory Visit: Payer: Self-pay | Admitting: Family Medicine

## 2022-05-10 ENCOUNTER — Encounter: Payer: Self-pay | Admitting: *Deleted

## 2022-05-27 ENCOUNTER — Encounter: Payer: Self-pay | Admitting: Family Medicine

## 2022-05-27 ENCOUNTER — Ambulatory Visit (INDEPENDENT_AMBULATORY_CARE_PROVIDER_SITE_OTHER): Payer: Managed Care, Other (non HMO) | Admitting: Family Medicine

## 2022-05-27 VITALS — BP 119/72 | HR 85 | Temp 98.0°F | Ht 69.0 in | Wt 138.6 lb

## 2022-05-27 DIAGNOSIS — T7840XA Allergy, unspecified, initial encounter: Secondary | ICD-10-CM

## 2022-05-27 DIAGNOSIS — F419 Anxiety disorder, unspecified: Secondary | ICD-10-CM

## 2022-05-27 MED ORDER — PREDNISONE 20 MG PO TABS: 40.0000 mg | ORAL_TABLET | Freq: Every day | ORAL | 0 refills | Status: DC

## 2022-05-27 MED ORDER — GUAIFENESIN-CODEINE 100-10 MG/5ML PO SOLN: 5.0000 mL | Freq: Three times a day (TID) | ORAL | 0 refills | Status: DC | PRN

## 2022-05-27 MED ORDER — AMOXICILLIN-POT CLAVULANATE 875-125 MG PO TABS: 1.0000 | ORAL_TABLET | Freq: Two times a day (BID) | ORAL | 0 refills | Status: DC

## 2022-05-27 NOTE — Assessment & Plan Note (Signed)
Likely contributing to above sinus infection.  She will continue taking her Singulair daily.  Recommend she take albuterol inhaler more frequently as above to help with her bronchospasm and cough.

## 2022-05-27 NOTE — Progress Notes (Signed)
   Becky Aguilar is a 51 y.o. female who presents today for an office visit.  Assessment/Plan:  New/Acute Problems: Sinusitis  No red flags.  Given length of symptoms will start Augmentin.  She has Astelin nasal spray at home which she will start using this.  We will start prednisone burst.  She has tolerated this well in the past.  Given her severe degree of cough we will also send a prescription for guaifenesin-codeine cough syrup.  Encouraged hydration.  She may be having some underlying bronchospasm and recommend she start her albuterol inhaler as well.  She will let me know if not improving in the next week or so.  Chronic Problems Addressed Today: Allergies Likely contributing to above sinus infection.  She will continue taking her Singulair daily.  Recommend she take albuterol inhaler more frequently as above to help with her bronchospasm and cough.  Anxiety Do not think prednisone will worsen her symptoms however advised her to let us know if she can decrease the prednisone.  We will continue her Paxil 10 mg daily.     Subjective:  HPI:  Patient here with cough. Started about 10 days ago. Lots of post nasal drip. Tried taking delsym and afrin nasal spray. Also tried benadryl with modest improvement. No sleep due to cough. No fevers or chill. Covid test was negative x3. No wheezing. No shortness of breath.        Objective:  Physical Exam: BP 119/72   Pulse 85   Temp 98 F (36.7 C) (Temporal)   Ht 5\' 9"  (1.753 m)   Wt 138 lb 9.6 oz (62.9 kg)   LMP 10/06/2011 (Exact Date)   SpO2 97%   BMI 20.47 kg/m   Gen: No acute distress, resting comfortably HEENT: [place] clear effusion.  OP erythematous.  Nasal [place] erythematous and boggy bilaterally. CV: Regular rate and rhythm with no murmurs appreciated Pulm: Normal work of breathing, clear to auscultation bilaterally with no crackles, wheezes, or rhonchi Neuro: Grossly normal, moves all extremities Psych: Normal affect and  thought content      Becky Musleh M. Jerline Pain, MD 05/27/2022 12:09 PM

## 2022-05-27 NOTE — Assessment & Plan Note (Signed)
Do not think prednisone will worsen her symptoms however advised her to let us know if she can decrease the prednisone.  We will continue her Paxil 10 mg daily.

## 2022-05-27 NOTE — Patient Instructions (Signed)
It was very nice to see you today!  I think you have a sinus infection.  Please start the Augmentin and prednisone.  You can use Astelin nasal spray.  Please use your albuterol inhaler.  Make sure that you are getting plenty of fluids.  Let me know if not improving by next week.  Take care, Dr Jerline Pain  PLEASE NOTE:  If you had any lab tests please let us know if you have not heard back within a few days. You may see your results on mychart before we have a chance to review them but we will give you a call once they are reviewed by [place]. If we ordered any referrals today, please let us know if you have not heard from their office within the next week.   Please try these tips to maintain a healthy lifestyle:  Eat at least 3 REAL meals and 1-2 snacks per day.  Aim for no more than 5 hours between eating.  If you eat breakfast, please do so within one hour of getting up.   Each meal should contain half fruits/vegetables, one quarter protein, and one quarter carbs (no bigger than a computer mouse)  Cut down on sweet beverages. This includes juice, soda, and sweet tea.   Drink at least 1 glass of water with each meal and aim for at least 8 glasses per day  Exercise at least 150 minutes every week.

## 2022-05-30 ENCOUNTER — Encounter: Payer: Self-pay | Admitting: Family Medicine

## 2022-05-31 NOTE — Telephone Encounter (Signed)
See note

## 2022-06-01 ENCOUNTER — Telehealth: Payer: Self-pay | Admitting: Family Medicine

## 2022-06-01 NOTE — Telephone Encounter (Signed)
Patient states Dr. Jerline Pain advised Patient to let him know if she was not feeling better by today.  Patient states she is not feeling better (sinus' are feeling a little better-no improvement for cough-still some wheezing).

## 2022-06-01 NOTE — Telephone Encounter (Signed)
I appreciate the update. I hate to hear she is not feeling better. Please send in a zpack. She should let us know if not improving with this and we can get a chest xray.  Algis Greenhouse. Jerline Pain, MD 06/01/2022 7:29 AM

## 2022-06-02 ENCOUNTER — Other Ambulatory Visit: Payer: Self-pay

## 2022-06-02 MED ORDER — AZITHROMYCIN 250 MG PO TABS: ORAL_TABLET | ORAL | 0 refills | Status: DC

## 2022-06-02 NOTE — Telephone Encounter (Signed)
Please see mychart message from yesterday.  Becky Aguilar. Jerline Pain, MD 06/02/2022 7:27 AM

## 2022-06-23 ENCOUNTER — Ambulatory Visit: Payer: Managed Care, Other (non HMO) | Admitting: Family Medicine

## 2022-07-29 ENCOUNTER — Encounter: Payer: Self-pay | Admitting: *Deleted

## 2022-07-29 ENCOUNTER — Other Ambulatory Visit: Payer: Self-pay | Admitting: Family Medicine

## 2022-11-13 ENCOUNTER — Other Ambulatory Visit: Payer: Self-pay | Admitting: Family Medicine

## 2022-11-23 ENCOUNTER — Other Ambulatory Visit: Payer: Self-pay | Admitting: Family Medicine

## 2022-12-16 ENCOUNTER — Other Ambulatory Visit: Payer: Self-pay | Admitting: Family Medicine

## 2022-12-24 ENCOUNTER — Other Ambulatory Visit: Payer: Self-pay | Admitting: General Surgery

## 2022-12-24 DIAGNOSIS — Z803 Family history of malignant neoplasm of breast: Secondary | ICD-10-CM

## 2022-12-24 DIAGNOSIS — Z1239 Encounter for other screening for malignant neoplasm of breast: Secondary | ICD-10-CM

## 2023-01-19 ENCOUNTER — Ambulatory Visit
Admission: RE | Admit: 2023-01-19 | Discharge: 2023-01-19 | Disposition: A | Discharge status: Home / Self Care | Payer: Managed Care, Other (non HMO) | Source: Ambulatory Visit | Attending: General Surgery | Admitting: General Surgery

## 2023-01-19 DIAGNOSIS — Z803 Family history of malignant neoplasm of breast: Secondary | ICD-10-CM

## 2023-01-19 DIAGNOSIS — Z1239 Encounter for other screening for malignant neoplasm of breast: Secondary | ICD-10-CM

## 2023-01-19 MED ORDER — GADOPICLENOL 0.5 MMOL/ML IV SOLN
6.0000 mL | Freq: Once | INTRAVENOUS | Status: AC | PRN
  Administered 2023-01-19: 6 mL via INTRAVENOUS

## 2023-01-20 ENCOUNTER — Other Ambulatory Visit: Payer: Self-pay | Admitting: General Surgery

## 2023-01-20 DIAGNOSIS — R9389 Abnormal findings on diagnostic imaging of other specified body structures: Secondary | ICD-10-CM

## 2023-01-28 ENCOUNTER — Ambulatory Visit
Admission: RE | Admit: 2023-01-28 | Discharge: 2023-01-28 | Disposition: A | Discharge status: Home / Self Care | Payer: Managed Care, Other (non HMO) | Source: Ambulatory Visit | Attending: General Surgery | Admitting: General Surgery

## 2023-01-28 DIAGNOSIS — R9389 Abnormal findings on diagnostic imaging of other specified body structures: Secondary | ICD-10-CM

## 2023-01-28 MED ORDER — GADOPICLENOL 0.5 MMOL/ML IV SOLN
6.0000 mL | Freq: Once | INTRAVENOUS | Status: AC | PRN
  Administered 2023-01-28: 6 mL via INTRAVENOUS

## 2023-02-04 ENCOUNTER — Other Ambulatory Visit: Payer: Self-pay | Admitting: General Surgery

## 2023-02-04 DIAGNOSIS — Z803 Family history of malignant neoplasm of breast: Secondary | ICD-10-CM

## 2023-05-11 ENCOUNTER — Other Ambulatory Visit: Payer: Self-pay | Admitting: Family Medicine

## 2023-06-22 ENCOUNTER — Encounter: Payer: Self-pay | Admitting: Family Medicine

## 2023-06-22 ENCOUNTER — Ambulatory Visit (INDEPENDENT_AMBULATORY_CARE_PROVIDER_SITE_OTHER): Payer: Managed Care, Other (non HMO) | Admitting: Family Medicine

## 2023-06-22 VITALS — BP 107/73 | HR 77 | Temp 97.1°F | Ht 69.0 in | Wt 131.6 lb

## 2023-06-22 DIAGNOSIS — R21 Rash and other nonspecific skin eruption: Secondary | ICD-10-CM

## 2023-06-22 NOTE — Progress Notes (Signed)
   Becky Aguilar is a 52 y.o. female who presents today for an office visit.  Assessment/Plan:  Rash Consistent with contact dermatitis.  Symptoms are improving.  No signs of infection.  She has topical steroid that she will use at home.  She will let us know if not improving in the next 1 to 2 weeks.     Subjective:  HPI:  Patient here with rash on left wrist. This started a couple of weeks ago. Started as a small lesion. She was concerned it was a insect bite.  Had telehealth visit and was started on antibiotics prednisone.  Rash is improving.  She has been outside in gathering at least.  No obvious exposures.       Objective:  Physical Exam: BP 107/73   Pulse 77   Temp (!) 97.1 F (36.2 C) (Temporal)   Ht 5\' 9"  (1.753 m)   Wt 131 lb 9.6 oz (59.7 kg)   LMP 10/06/2011 (Exact Date)   SpO2 99%   BMI 19.43 kg/m   Gen: No acute distress, resting comfortably Skin: Linear erythematous rash on left wrist.  No signs of cellulitis. Neuro: Grossly normal, moves all extremities Psych: Normal affect and thought content      [name] M. Jimmey Ralph, MD 06/22/2023 10:14 AM

## 2023-07-01 ENCOUNTER — Other Ambulatory Visit: Payer: Self-pay | Admitting: Family Medicine

## 2023-07-12 ENCOUNTER — Ambulatory Visit: Payer: Managed Care, Other (non HMO) | Admitting: Family Medicine

## 2023-07-12 ENCOUNTER — Encounter: Payer: Self-pay | Admitting: Family Medicine

## 2023-07-12 VITALS — BP 102/71 | HR 84 | Temp 98.6°F | Ht 69.0 in | Wt 129.0 lb

## 2023-07-12 DIAGNOSIS — G43809 Other migraine, not intractable, without status migrainosus: Secondary | ICD-10-CM

## 2023-07-12 DIAGNOSIS — R519 Headache, unspecified: Secondary | ICD-10-CM | POA: Diagnosis not present

## 2023-07-12 LAB — SEDIMENTATION RATE: Sed Rate: 10 mm/h (ref 0–30)

## 2023-07-12 MED ORDER — PREDNISONE 50 MG PO TABS
ORAL_TABLET | ORAL | 0 refills | Status: DC
Start: 2023-07-12 — End: 2023-07-29

## 2023-07-12 MED ORDER — BACLOFEN 10 MG PO TABS: 5.0000 mg | ORAL_TABLET | Freq: Three times a day (TID) | ORAL | 0 refills | Status: DC

## 2023-07-12 MED ORDER — GABAPENTIN 100 MG PO CAPS: 100.0000 mg | ORAL_CAPSULE | Freq: Every day | ORAL | 3 refills | Status: DC

## 2023-07-12 NOTE — Assessment & Plan Note (Addendum)
Overall migraines been stable though has had a mild flare recently due to her above present occipital neuralgia/cervical radiculopathy.  Current headache is not consistent with her previous migraines.  She will continue her propranolol 60 mg daily and Imitrex 50 mg daily as needed.  We discussed reasons return to care as above.

## 2023-07-12 NOTE — Patient Instructions (Signed)
It was very nice to see you today!  I think you have a pinched nerve in your neck.  Please start the prednisone.  You can also take the gabapentin and baclofen.  Work on the exercises.  You can also use a heating pad to the area.  We will check a blood test today to make sure that you do not have any signs of inflammation.  Please follow-up with [place] next week to let us know how you are doing.  Return if symptoms worsen or fail to improve.   Take care, Dr Jimmey Ralph  PLEASE NOTE:  If you had any lab tests, please let us know if you have not heard back within a few days. You may see your results on mychart before we have a chance to review them but we will give you a call once they are reviewed by [place].   If we ordered any referrals today, please let us know if you have not heard from their office within the next week.   If you had any urgent prescriptions sent in today, please check with the pharmacy within an hour of our visit to make sure the prescription was transmitted appropriately.   Please try these tips to maintain a healthy lifestyle:  Eat at least 3 REAL meals and 1-2 snacks per day.  Aim for no more than 5 hours between eating.  If you eat breakfast, please do so within one hour of getting up.   Each meal should contain half fruits/vegetables, one quarter protein, and one quarter carbs (no bigger than a computer mouse)  Cut down on sweet beverages. This includes juice, soda, and sweet tea.   Drink at least 1 glass of water with each meal and aim for at least 8 glasses per day  Exercise at least 150 minutes every week.

## 2023-07-12 NOTE — Progress Notes (Signed)
   Becky Aguilar is a 52 y.o. female who presents today for an office visit.  Assessment/Plan:  New/Acute Problems: Headache / Neck Pain History consistent with occipital neuralgia.  She also has a positive Tinel sign on exam which supports this as well.  Otherwise her neurologic exam today is reassuring and she has no other red flag signs or symptoms.  She does have a history of migraine however her current headache and neck pain is not consistent with prior migraines.  Even though it is unlikely based on her history and exam we will check a sed rate today to rule out temporal arteritis.  She likely does have some component of the neck strain as well as cervical radiculopathy as well based on her history.  Given her normal exam and lack of other red flag signs or symptoms do not think we need to obtain imaging at this point.    Will treat with course of prednisone.  Also start baclofen.  Will also start low-dose gabapentin.  She will follow-up with me next week.  If not improving would consider imaging at that time.  We discussed reasons to return to care and seek emergent care.  Chronic Problems Addressed Today: Migraine Overall migraines been stable though has had a mild flare recently due to her above present occipital neuralgia/cervical radiculopathy.  Current headache is not consistent with her previous migraines.  She will continue her propranolol 60 mg daily and Imitrex 50 mg daily as needed.  We discussed reasons return to care as above.     Subjective:  HPI:  Patient here with headache and neck pain. Pain mostly located in the back of her scalp and radiating to the top of her head. All is located on the right side of her headache. Pain sometimes wakes her up early in the morning. This started a few weeks ago. No obvious injuries or precipitating events. Tried Imitrex with some improvement. This is not consistent with a typical migraine. No fevers or chills. No weakness or numbness.  She did have some slightly tingling in her right little finger.  This has been mild.  She also had some mild tingling on her scalp although this has since resolved.  She has tried Imitrex with modest improvement however symptoms returned shortly afterwards.  Pain has been persistent for the last couple of weeks.        Objective:  Physical Exam: BP 102/71   Pulse 84   Temp 98.6 F (37 C) (Temporal)   Ht 5\' 9"  (1.753 m)   Wt 129 lb (58.5 kg)   LMP 10/06/2011 (Exact Date)   BMI 19.05 kg/m   Gen: No acute distress, resting comfortably CV: Regular rate and rhythm with no murmurs appreciated Pulm: Normal work of breathing, clear to auscultation bilaterally with no crackles, wheezes, or rhonchi Neuro: Cranial nerves III through XII intact.  Finger-nose-finger testing intact bilaterally.  Strength 5 out of 5 in upper and lower extremities.  Sensation light touch intact throughout MUSCULOSKELETAL - Neck: No deformities.  Tenderness to palpation along right cervical paraspinal muscles.  Positive Tinel sign at right occipital base which induces radiating pain towards the front of her scalp. Psych: Normal affect and thought content      Missie Gehrig M. Jimmey Ralph, MD 07/12/2023 11:01 AM

## 2023-07-13 NOTE — Progress Notes (Signed)
Great news! Her inflammatory markers are normal.  We do not need to do any other testing at this point.  She should let us know if her symptoms are not improving.

## 2023-07-29 ENCOUNTER — Ambulatory Visit (INDEPENDENT_AMBULATORY_CARE_PROVIDER_SITE_OTHER): Payer: Managed Care, Other (non HMO) | Admitting: Family Medicine

## 2023-07-29 VITALS — BP 101/69 | HR 63 | Temp 97.5°F | Ht 69.0 in | Wt 131.4 lb

## 2023-07-29 DIAGNOSIS — F419 Anxiety disorder, unspecified: Secondary | ICD-10-CM

## 2023-07-29 DIAGNOSIS — Z131 Encounter for screening for diabetes mellitus: Secondary | ICD-10-CM

## 2023-07-29 DIAGNOSIS — E0500 Thyrotoxicosis with diffuse goiter without thyrotoxic crisis or storm: Secondary | ICD-10-CM

## 2023-07-29 DIAGNOSIS — Z0000 Encounter for general adult medical examination without abnormal findings: Secondary | ICD-10-CM | POA: Diagnosis not present

## 2023-07-29 DIAGNOSIS — E785 Hyperlipidemia, unspecified: Secondary | ICD-10-CM

## 2023-07-29 DIAGNOSIS — T7840XA Allergy, unspecified, initial encounter: Secondary | ICD-10-CM

## 2023-07-29 DIAGNOSIS — G43809 Other migraine, not intractable, without status migrainosus: Secondary | ICD-10-CM | POA: Diagnosis not present

## 2023-07-29 NOTE — Progress Notes (Signed)
Chief Complaint:  Becky Aguilar is a 52 y.o. female who presents today for her annual comprehensive physical exam.    Assessment/Plan:  Chronic Problems Addressed Today: Dyslipidemia Check lipids.  Discussed lifestyle modifications.  Migraine Overall symptoms are stable.  She has had significant improvement with starting baclofen a couple of weeks ago.  We will take the gabapentin and prednisone off her medication list.  She did not feel like gabapentin was effective.  Will continue with her current regimen for now with baclofen as needed, Imitrex as needed, and propranolol 60 mg daily.    Would consider referral to neurology if symptoms are not managed with above if she has recurrence occipital neuralgia symptoms.  Anxiety Stable on Paxil 10 mg daily.  Allergies Stable on Singulair 10 mg daily.  Graves disease Not currently on any medications.  Check TSH today.  Preventative Healthcare: Check labs.  Up-to-date on vaccines.  Up-to-date on colon cancer screening.  Follows with gynecology for women's health.  Patient Counseling(The following topics were reviewed and/or handout was given):  -Nutrition: Stressed importance of moderation in sodium/caffeine intake, saturated fat and cholesterol, caloric balance, sufficient intake of fresh fruits, vegetables, and fiber.  -Stressed the importance of regular exercise.   -Substance Abuse: Discussed cessation/primary prevention of tobacco, alcohol, or other drug use; driving or other dangerous activities under the influence; availability of treatment for abuse.   -Injury prevention: Discussed safety belts, safety helmets, smoke detector, smoking near bedding or upholstery.   -Sexuality: Discussed sexually transmitted diseases, partner selection, use of condoms, avoidance of unintended pregnancy and contraceptive alternatives.   -Dental health: Discussed importance of regular tooth brushing, flossing, and dental visits.  -Health  maintenance and immunizations reviewed. Please refer to Health maintenance section.  Return to care in 1 year for next preventative visit.     Subjective:  HPI:  She has no acute complaints today. See assessment / plan for status of chronic conditions.  I last saw her a couple weeks ago.  At that time she was having headache and neck pain.  Concern for occipital neuralgia.  We gave her a course of prednisone, baclofen, and gabapentin.  She does not think the gabapentin is help.  She has finished her course of prednisone.  She does think the baclofen has helped significantly.  Her symptoms are much improved.  Lifestyle Diet: Balanced. Plenty of fruits and vegetables.  Exercise: Walks routinely.      07/12/2023   10:20 AM  Depression screen PHQ 2/9  Decreased Interest 0  Down, Depressed, Hopeless 0  PHQ - 2 Score 0    There are no preventive care reminders to display for this patient.   ROS: Per HPI, otherwise a complete review of systems was negative.   PMH:  The following were reviewed and entered/updated in epic: Past Medical History:  Diagnosis Date   Allergy    Anemia    Anxiety    ASCUS with positive high risk HPV cervical 11/2016   Negative subtype 16, 18/45   Asthma    Graves' disease in remission    Headache(784.0)    HPV test positive 10/2014, 10/2015, 11/2017   Subtype 16, 18/45 negative in 2018.    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   VIN (vulval intraepithelial neoplasia) I 2006   Patient Active Problem List   Diagnosis Date Noted   Dyslipidemia 11/10/2021   Migraine 11/17/2020   Allergies 11/17/2020   Anxiety 11/17/2020   Graves disease 11/17/2020  Past Surgical History:  Procedure Laterality Date   RECTOCELE REPAIR  10/18/2011   Procedure: POSTERIOR REPAIR (RECTOCELE)   VAGINAL HYSTERECTOMY  10/18/2011   Procedure: HYSTERECTOMY VAGINAL for uterine prolapse   WISDOM TOOTH EXTRACTION      Family History  Problem Relation Age of  Onset   Cancer Father        prostate and brain   Prostate cancer Father    Brain cancer Father    High Cholesterol Father    Cancer Maternal Grandmother        Liver   Liver cancer Maternal Grandmother        primary site Colon ., mets to liver    Colon cancer Maternal Grandmother    Breast cancer Paternal Grandmother        Age unknown   Cancer Daughter        kidney   Kidney cancer Daughter    Colon polyps Brother    Colon cancer Maternal Aunt    Cancer Mother    Heart disease Mother    High Cholesterol Mother    Esophageal cancer Neg Hx    Rectal cancer Neg Hx    Stomach cancer Neg Hx     Medications- reviewed and updated Current Outpatient Medications  Medication Sig Dispense Refill   albuterol (PROVENTIL) (2.5 MG/3ML) 0.083% nebulizer solution Take 2.5 mg by nebulization every 6 (six) hours as needed for wheezing or shortness of breath.     albuterol (VENTOLIN HFA) 108 (90 Base) MCG/ACT inhaler Inhale into the lungs every 6 (six) hours as needed for wheezing or shortness of breath.     aspirin-acetaminophen-caffeine (EXCEDRIN MIGRAINE) 250-250-65 MG per tablet Take 1 tablet by mouth daily as needed. For headache     baclofen (LIORESAL) 10 MG tablet Take 0.5 tablets (5 mg total) by mouth 3 (three) times daily. 30 each 0   montelukast (SINGULAIR) 10 MG tablet TAKE 1 TABLET BY MOUTH AT BEDTIME 90 tablet 3   Multiple Vitamin (MULTIVITAMIN) tablet Take 1 tablet by mouth daily.     PARoxetine (PAXIL) 10 MG tablet TAKE ONE TABLET BY MOUTH DAILY 90 tablet 3   Salicylic Acid (PROPA PH ACNE MED CLEANSING EX) Apply topically.     SUMAtriptan (IMITREX) 50 MG tablet TAKE 1 TABLET BY MOUTH AT ONSET OF HEADACHE; MAY REPEAT 1 TABLET IN 2 HOURS IF NEEDED. 20 tablet 5   No current facility-administered medications for this visit.    Allergies-reviewed and updated Allergies  Allergen Reactions   Doxycycline Rash    Social History   Socioeconomic History   Marital status:  Divorced    Spouse name: Not on file   Number of children: Not on file   Years of education: Not on file   Highest education level: Not on file  Occupational History   Not on file  Tobacco Use   Smoking status: Never   Smokeless tobacco: Never  Vaping Use   Vaping status: Never Used  Substance and Sexual Activity   Alcohol use: Yes    Alcohol/week: 0.0 standard drinks of alcohol    Comment: rare   Drug use: No   Sexual activity: Not Currently    Birth control/protection: Surgical    Comment: HYST-1st intercourse 52 yo-Fewer than 5 partners  Other Topics Concern   Not on file  Social History Narrative   Not on file   Social Drivers of Health   Financial Resource Strain: Not on file  Food Insecurity: No  Food Insecurity (12/29/2020)   Received from [facility], [facility]   Hunger Vital Sign    Worried About Running Out of Food in the Last Year: Never true    Ran Out of Food in the Last Year: Never true  Transportation Needs: Not on file  Physical Activity: Not on file  Stress: Not on file  Social Connections: Unknown (12/28/2021)   Received from [facility], [facility]   Social Network    Social Network: Not on file        Objective:  Physical Exam: BP 101/69   Pulse 63   Temp (!) 97.5 F (36.4 C) (Temporal)   Ht 5\' 9"  (1.753 m)   Wt 131 lb 6.4 oz (59.6 kg)   LMP 10/06/2011 (Exact Date)   SpO2 100%   BMI 19.40 kg/m   Body mass index is 19.4 kg/m. Wt Readings from Last 3 Encounters:  07/29/23 131 lb 6.4 oz (59.6 kg)  07/12/23 129 lb (58.5 kg)  06/22/23 131 lb 9.6 oz (59.7 kg)   Gen: NAD, resting comfortably HEENT: TMs normal bilaterally. OP clear. No thyromegaly noted.  CV: RRR with no murmurs appreciated Pulm: NWOB, CTAB with no crackles, wheezes, or rhonchi GI: Normal bowel sounds present. Soft, Nontender, Nondistended. MSK: no edema, cyanosis, or clubbing noted Skin: warm, dry Neuro: CN2-12 grossly intact. Strength 5/5 in upper and lower  extremities. Reflexes symmetric and intact bilaterally.  Psych: Normal affect and thought content     Lavren Lewan M. Jimmey Ralph, MD 07/29/2023 9:36 AM

## 2023-07-29 NOTE — Assessment & Plan Note (Signed)
Not currently on any medications.  Check TSH today.

## 2023-07-29 NOTE — Patient Instructions (Signed)
It was very nice to see you today!  I am glad that you are feeling better.  We will continue current medications.  Please continue to work on diet and exercise.  Will check blood work today.  I will see back in year for your next physical.  Come back sooner if needed.  Return in about 1 year (around 07/28/2024) for Annual Physical.   Take care, Dr Jimmey Ralph  PLEASE NOTE:  If you had any lab tests, please let us know if you have not heard back within a few days. You may see your results on mychart before we have a chance to review them but we will give you a call once they are reviewed by [place].   If we ordered any referrals today, please let us know if you have not heard from their office within the next week.   If you had any urgent prescriptions sent in today, please check with the pharmacy within an hour of our visit to make sure the prescription was transmitted appropriately.   Please try these tips to maintain a healthy lifestyle:  Eat at least 3 REAL meals and 1-2 snacks per day.  Aim for no more than 5 hours between eating.  If you eat breakfast, please do so within one hour of getting up.   Each meal should contain half fruits/vegetables, one quarter protein, and one quarter carbs (no bigger than a computer mouse)  Cut down on sweet beverages. This includes juice, soda, and sweet tea.   Drink at least 1 glass of water with each meal and aim for at least 8 glasses per day  Exercise at least 150 minutes every week.    Preventive Care 19-35 Years Old, Female Preventive care refers to lifestyle choices and visits with your health care provider that can promote health and wellness. Preventive care visits are also called wellness exams. What can I expect for my preventive care visit? Counseling Your health care provider may ask you questions about your: Medical history, including: Past medical problems. Family medical history. Pregnancy history. Current health,  including: Menstrual cycle. Method of birth control. Emotional well-being. Home life and relationship well-being. Sexual activity and sexual health. Lifestyle, including: Alcohol, nicotine or tobacco, and drug use. Access to firearms. Diet, exercise, and sleep habits. Work and work Astronomer. Sunscreen use. Safety issues such as seatbelt and bike helmet use. Physical exam Your health care provider will check your: Height and weight. These may be used to calculate your BMI (body mass index). BMI is a measurement that tells if you are at a healthy weight. Waist circumference. This measures the distance around your waistline. This measurement also tells if you are at a healthy weight and may help predict your risk of certain diseases, such as type 2 diabetes and high blood pressure. Heart rate and blood pressure. Body temperature. Skin for abnormal spots. What immunizations do I need?  Vaccines are usually given at various ages, according to a schedule. Your health care provider will recommend vaccines for you based on your age, medical history, and lifestyle or other factors, such as travel or where you work. What tests do I need? Screening Your health care provider may recommend screening tests for certain conditions. This may include: Lipid and cholesterol levels. Diabetes screening. This is done by checking your blood sugar (glucose) after you have not eaten for a while (fasting). Pelvic exam and Pap test. Hepatitis B test. Hepatitis C test. HIV (human immunodeficiency virus) test. STI (sexually  transmitted infection) testing, if you are at risk. Lung cancer screening. Colorectal cancer screening. Mammogram. Talk with your health care provider about when you should start having regular mammograms. This may depend on whether you have a family history of breast cancer. BRCA-related cancer screening. This may be done if you have a family history of breast, ovarian, tubal, or  peritoneal cancers. Bone density scan. This is done to screen for osteoporosis. Talk with your health care provider about your test results, treatment options, and if necessary, the need for more tests. Follow these instructions at home: Eating and drinking  Eat a diet that includes fresh fruits and vegetables, whole grains, lean protein, and low-fat dairy products. Take vitamin and mineral supplements as recommended by your health care provider. Do not drink alcohol if: Your health care provider tells you not to drink. You are pregnant, may be pregnant, or are planning to become pregnant. If you drink alcohol: Limit how much you have to 0-1 drink a day. Know how much alcohol is in your drink. In the [place] one drink equals one 12 oz bottle of beer (355 mL), one 5 oz glass of wine (148 mL), or one 1 oz glass of hard liquor (44 mL). Lifestyle Brush your teeth every morning and night with fluoride toothpaste. Floss one time each day. Exercise for at least 30 minutes 5 or more days each week. Do not use any products that contain nicotine or tobacco. These products include cigarettes, chewing tobacco, and vaping devices, such as e-cigarettes. If you need help quitting, ask your health care provider. Do not use drugs. If you are sexually active, practice safe sex. Use a condom or other form of protection to prevent STIs. If you do not wish to become pregnant, use a form of birth control. If you plan to become pregnant, see your health care provider for a prepregnancy visit. Take aspirin only as told by your health care provider. Make sure that you understand how much to take and what form to take. Work with your health care provider to find out whether it is safe and beneficial for you to take aspirin daily. Find healthy ways to manage stress, such as: Meditation, yoga, or listening to music. Journaling. Talking to a trusted person. Spending time with friends and family. Minimize exposure to UV  radiation to reduce your risk of skin cancer. Safety Always wear your seat belt while driving or riding in a vehicle. Do not drive: If you have been drinking alcohol. Do not ride with someone who has been drinking. When you are tired or distracted. While texting. If you have been using any mind-altering substances or drugs. Wear a helmet and other protective equipment during sports activities. If you have firearms in your house, make sure you follow all gun safety procedures. Seek help if you have been physically or sexually abused. What's next? Visit your health care provider once a year for an annual wellness visit. Ask your health care provider how often you should have your eyes and teeth checked. Stay up to date on all vaccines. This information is not intended to replace advice given to you by your health care provider. Make sure you discuss any questions you have with your health care provider. Document Revised: 01/28/2021 Document Reviewed: 01/28/2021 Elsevier Patient Education  2024 [employer].

## 2023-07-29 NOTE — Assessment & Plan Note (Signed)
Check lipids. Discussed lifestyle modifications.  

## 2023-07-29 NOTE — Assessment & Plan Note (Signed)
Stable on Singulair 10 mg daily. 

## 2023-07-29 NOTE — Assessment & Plan Note (Signed)
Overall symptoms are stable.  She has had significant improvement with starting baclofen a couple of weeks ago.  We will take the gabapentin and prednisone off her medication list.  She did not feel like gabapentin was effective.  Will continue with her current regimen for now with baclofen as needed, Imitrex as needed, and propranolol 60 mg daily.    Would consider referral to neurology if symptoms are not managed with above if she has recurrence occipital neuralgia symptoms.

## 2023-07-29 NOTE — Assessment & Plan Note (Signed)
Stable on Paxil 10 mg daily. ?

## 2023-08-02 ENCOUNTER — Other Ambulatory Visit (INDEPENDENT_AMBULATORY_CARE_PROVIDER_SITE_OTHER): Payer: Managed Care, Other (non HMO)

## 2023-08-02 DIAGNOSIS — E785 Hyperlipidemia, unspecified: Secondary | ICD-10-CM | POA: Diagnosis not present

## 2023-08-02 DIAGNOSIS — Z131 Encounter for screening for diabetes mellitus: Secondary | ICD-10-CM | POA: Diagnosis not present

## 2023-08-02 DIAGNOSIS — Z0000 Encounter for general adult medical examination without abnormal findings: Secondary | ICD-10-CM

## 2023-08-02 LAB — COMPREHENSIVE METABOLIC PANEL
ALT: 14 U/L (ref 0–35)
AST: 18 U/L (ref 0–37)
Albumin: 3.9 g/dL (ref 3.5–5.2)
Alkaline Phosphatase: 50 U/L (ref 39–117)
BUN: 8 mg/dL (ref 6–23)
CO2: 26 meq/L (ref 19–32)
Calcium: 8.5 mg/dL (ref 8.4–10.5)
Chloride: 106 meq/L (ref 96–112)
Creatinine, Ser: 0.62 mg/dL (ref 0.40–1.20)
GFR: 103.56 mL/min (ref >60.00)
Glucose, Bld: 89 mg/dL (ref 70–99)
Potassium: 3.2 meq/L — ABNORMAL LOW (ref 3.5–5.1)
Sodium: 139 meq/L (ref 135–145)
Total Bilirubin: 0.4 mg/dL (ref 0.2–1.2)
Total Protein: 6.2 g/dL (ref 6.0–8.3)

## 2023-08-02 LAB — CBC WITH DIFFERENTIAL/PLATELET
Basophils Absolute: 0 10*3/uL (ref 0.0–0.1)
Basophils Relative: 1.3 % (ref 0.0–3.0)
Eosinophils Absolute: 0.1 10*3/uL (ref 0.0–0.7)
Eosinophils Relative: 2.6 % (ref 0.0–5.0)
HCT: 40.5 % (ref 36.0–46.0)
Hemoglobin: 13.5 g/dL (ref 12.0–15.0)
Lymphocytes Relative: 38.5 % (ref 12.0–46.0)
Lymphs Abs: 1.4 10*3/uL (ref 0.7–4.0)
MCHC: 33.3 g/dL (ref 30.0–36.0)
MCV: 97.7 fL (ref 78.0–100.0)
Monocytes Absolute: 0.3 10*3/uL (ref 0.1–1.0)
Monocytes Relative: 8.7 % (ref 3.0–12.0)
Neutro Abs: 1.8 10*3/uL (ref 1.4–7.7)
Neutrophils Relative %: 48.9 % (ref 43.0–77.0)
Platelets: 234 10*3/uL (ref 150.0–400.0)
RBC: 4.15 Mil/uL (ref 3.87–5.11)
RDW: 13.8 % (ref 11.5–15.5)
WBC: 3.7 10*3/uL — ABNORMAL LOW (ref 4.0–10.5)

## 2023-08-02 LAB — LIPID PANEL
Cholesterol: 193 mg/dL (ref 0–200)
HDL: 65.3 mg/dL (ref >39.00)
LDL Cholesterol: 114 mg/dL — ABNORMAL HIGH (ref 0–99)
NonHDL: 127.97
Total CHOL/HDL Ratio: 3
Triglycerides: 70 mg/dL (ref 0.0–149.0)
VLDL: 14 mg/dL (ref 0.0–40.0)

## 2023-08-02 LAB — TSH: TSH: 1.17 u[IU]/mL (ref 0.35–5.50)

## 2023-08-02 LAB — HEMOGLOBIN A1C: Hgb A1c MFr Bld: 5.4 % (ref 4.6–6.5)

## 2023-08-02 NOTE — Addendum Note (Signed)
Addended by: Dyann Kief on: 08/02/2023 07:55 AM   Modules accepted: Orders

## 2023-08-04 ENCOUNTER — Encounter: Payer: Self-pay | Admitting: Family Medicine

## 2023-08-04 NOTE — Progress Notes (Signed)
Her potassium is a little low.  This can be seen with albuterol use.  I would like for her to come back and recheck in a couple of weeks if she is able to.  She should try to avoid using albuterol prior to this appointment if possible.  Please place future order for [place].  Cholesterol is a little bit elevated but stable compared to last year.  Do not need to start meds for this but she should continue to work on diet and exercise.  Everything else is within goal and we can recheck in a year.

## 2023-08-05 ENCOUNTER — Other Ambulatory Visit: Payer: Self-pay | Admitting: *Deleted

## 2023-08-05 DIAGNOSIS — E876 Hypokalemia: Secondary | ICD-10-CM

## 2023-08-05 NOTE — Telephone Encounter (Signed)
Left message to return call to our office at their convenience.  Please schedule a lab appt to recheck your potassium

## 2023-08-08 NOTE — Telephone Encounter (Signed)
Copied from CRM [id number]. Topic: Clinical - Medical Advice >> Aug 08, 2023 10:40 AM Corin V wrote: Reason for CRM: Patient calling in regarding her MyChart message. Patient has not used the albuterol in 1-2 years and states that would not be a factor in affecting her potassium. She is wanting to know if Dr. Jimmey Ralph is still wanting her to get the potassium lab redrawn, if he can send in a script, or if there is something else he would advise.   Spoke with patient stated not taking Rx albuterol for 1-2 years  Does not want to repeat lab due to cost  Requesting Rx potassium  Please advise

## 2023-08-29 ENCOUNTER — Telehealth: Payer: Self-pay | Admitting: Family Medicine

## 2023-08-29 NOTE — Telephone Encounter (Signed)
 Patient is calling in stating that she has low potassium she also stated that she has not used her albuterol in two years she would like a call back regarding this

## 2023-08-29 NOTE — Telephone Encounter (Signed)
 See message.

## 2023-08-29 NOTE — Telephone Encounter (Signed)
 See note

## 2023-08-30 NOTE — Telephone Encounter (Signed)
 Please see result note. Recommend she come back in to recheck potassium levels.  Katina Degree. Jimmey Ralph, MD 08/30/2023 12:50 PM

## 2023-08-31 NOTE — Telephone Encounter (Signed)
 Spoke with patient, advise Dr Daneil Dunker recommenced to recheck her potassium  Patient refused due to elevated medical bill at this time

## 2023-08-31 NOTE — Telephone Encounter (Signed)
 Left message to return call to our office at their convenience.

## 2023-09-13 ENCOUNTER — Encounter: Payer: Self-pay | Admitting: Family Medicine

## 2023-09-13 ENCOUNTER — Other Ambulatory Visit: Payer: Self-pay | Admitting: Family Medicine

## 2023-09-13 ENCOUNTER — Telehealth: Payer: Self-pay

## 2023-09-13 NOTE — Telephone Encounter (Signed)
Copied from CRM [id number]. Topic: Clinical - Medication Question >> Sep 13, 2023  9:11 AM Mosetta Putt H wrote: Reason for CRM: patient woke up with uti wondering if something can be called in  Patient will need to schedule an appointment to come in for exam and urine, please call pt and schedule appt.

## 2023-09-13 NOTE — Telephone Encounter (Signed)
Noted

## 2023-11-03 ENCOUNTER — Encounter: Payer: Self-pay | Admitting: Gastroenterology

## 2023-11-18 ENCOUNTER — Telehealth: Payer: Self-pay

## 2023-11-18 ENCOUNTER — Ambulatory Visit

## 2023-11-18 VITALS — Ht 69.0 in | Wt 135.0 lb

## 2023-11-18 DIAGNOSIS — Z1211 Encounter for screening for malignant neoplasm of colon: Secondary | ICD-10-CM

## 2023-11-18 MED ORDER — SUTAB 1479-225-188 MG PO TABS
ORAL_TABLET | ORAL | 0 refills | Status: DC
Start: 2023-11-18 — End: 2023-12-16

## 2023-11-18 NOTE — Progress Notes (Signed)
 No egg or soy allergy known to patient  No issues known to pt with past sedation with any surgeries or procedures Patient denies ever being told they had issues or difficulty with intubation  No FH of Malignant Hyperthermia Pt is not on diet pills Pt is not on  home 02  Pt is not on blood thinners  Pt denies issues with constipation  No A fib or A flutter Have any cardiac testing pending--No Pt can ambulate  Pt denies use of chewing tobacco Discussed diabetic I weight loss medication holds Discussed NSAID holds Checked BMI Pt instructed to use Singlecare.com or GoodRx for a price reduction on prep  Patient's chart reviewed by Becky Aguilar CNRA prior to previsit and patient appropriate for the [facility].  Pre visit completed and red dot placed by patient's name on their procedure day (on provider's schedule).

## 2023-12-09 ENCOUNTER — Encounter: Payer: Self-pay | Admitting: Gastroenterology

## 2023-12-16 ENCOUNTER — Ambulatory Visit (AMBULATORY_SURGERY_CENTER): Admitting: Gastroenterology

## 2023-12-16 ENCOUNTER — Encounter: Payer: Self-pay | Admitting: Gastroenterology

## 2023-12-16 VITALS — BP 107/66 | HR 64 | Temp 97.9°F | Resp 10 | Ht 69.0 in | Wt 135.0 lb

## 2023-12-16 DIAGNOSIS — K6289 Other specified diseases of anus and rectum: Secondary | ICD-10-CM | POA: Diagnosis not present

## 2023-12-16 DIAGNOSIS — K5730 Diverticulosis of large intestine without perforation or abscess without bleeding: Secondary | ICD-10-CM

## 2023-12-16 DIAGNOSIS — Z800 Family history of malignant neoplasm of digestive organs: Secondary | ICD-10-CM

## 2023-12-16 DIAGNOSIS — Z1211 Encounter for screening for malignant neoplasm of colon: Secondary | ICD-10-CM | POA: Diagnosis present

## 2023-12-16 MED ORDER — SODIUM CHLORIDE 0.9 % IV SOLN
500.0000 mL | Freq: Once | INTRAVENOUS | Status: DC
Start: 2023-12-16 — End: 2023-12-16

## 2023-12-16 MED ORDER — SODIUM CHLORIDE 0.9 % IV SOLN: 500.0000 mL | Freq: Once | INTRAVENOUS | Status: DC

## 2023-12-16 NOTE — Progress Notes (Signed)
 [facility] History and Physical   Primary Care Physician:  Rodney Clamp, MD   Reason for Procedure:   family history of colon cancer  Plan:    colonoscopy     HPI: Becky Aguilar is a 53 y.o. female  here for colonoscopy screening - first time exam. Aunt / grandmother with history of colon cancer, brother had colon polyps.  Patient denies any bowel symptoms at this time. Otherwise feels well without any cardiopulmonary symptoms.   I have discussed risks / benefits of anesthesia and endoscopic procedure with Aslynn C Wimes and they wish to proceed with the exams as outlined today.    Past Medical History:  Diagnosis Date   Allergy    Anemia    Anxiety    ASCUS with positive high risk HPV cervical 11/2016   Negative subtype 16, 18/45   Asthma    Graves' disease in remission    Headache(784.0)    HPV test positive 10/2014, 10/2015, 11/2017   Subtype 16, 18/45 negative in 2018.    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 Authorizing Provider  montelukast  (SINGULAIR ) 10 MG tablet TAKE 1 TABLET BY MOUTH AT BEDTIME 05/12/23  Yes Rodney Clamp, MD  PARoxetine  (PAXIL ) 10 MG tablet TAKE ONE TABLET BY MOUTH DAILY 11/24/22  Yes Parker, Caleb M, MD  albuterol (VENTOLIN HFA) 108 (90 Base) MCG/ACT inhaler Inhale into the lungs every 6 (six) hours as needed for wheezing or shortness of breath. Patient not taking: Reported on 12/16/2023    [provider]  aspirin-acetaminophen -caffeine  (EXCEDRIN MIGRAINE) 250-250-65 MG per tablet Take 1 tablet by mouth daily as needed. For headache     [provider]  baclofen  (LIORESAL ) 10 MG tablet TAKE A HALF TABLET BY MOUTH THREE TIMES A DAY 09/13/23   Rodney Clamp, MD  ketoconazole (NIZORAL) 2 % shampoo Apply 1 Application topically once. PRN    [provider]  Multiple Vitamin (MULTIVITAMIN) tablet Take 1 tablet by mouth daily.    [provider]  SUMAtriptan  (IMITREX ) 50 MG tablet TAKE 1 TABLET BY MOUTH AT ONSET OF HEADACHE; MAY REPEAT 1 TABLET IN 2 HOURS IF NEEDED. 07/04/23   Rodney Clamp, MD    Current Outpatient Medications  Medication Sig Dispense Refill   montelukast  (SINGULAIR ) 10 MG tablet TAKE 1 TABLET BY MOUTH AT BEDTIME 90 tablet 3   PARoxetine  (PAXIL ) 10 MG tablet TAKE ONE TABLET BY MOUTH DAILY 90 tablet 3   albuterol (VENTOLIN HFA) 108 (90 Base) MCG/ACT inhaler Inhale into the lungs every 6 (six) hours as needed for wheezing or shortness of breath. (Patient not taking: Reported on 12/16/2023)     aspirin-acetaminophen -caffeine  (EXCEDRIN MIGRAINE) 250-250-65 MG per tablet Take 1 tablet by mouth daily as needed. For headache     baclofen  (LIORESAL ) 10 MG tablet TAKE A HALF TABLET BY MOUTH THREE TIMES A DAY 30 tablet 1   ketoconazole (NIZORAL) 2 % shampoo Apply 1 Application topically once. PRN     Multiple Vitamin (MULTIVITAMIN) tablet Take 1 tablet by mouth daily.     SUMAtriptan  (IMITREX )  50 MG tablet TAKE 1 TABLET BY MOUTH AT ONSET OF HEADACHE; MAY REPEAT 1 TABLET IN 2 HOURS IF NEEDED. 20 tablet 5   Current Facility-Administered Medications  Medication Dose Route Frequency Provider Last Rate Last Admin   0.9 %  sodium chloride infusion  500 mL Intravenous Once Terrye Dombrosky, Lendon Queen, MD        Allergies as of 12/16/2023   (No Known Allergies)    Family History  Problem Relation Age of Onset   Cancer Father        prostate and brain   Prostate cancer Father    Brain cancer Father    High Cholesterol Father    Cancer Maternal Grandmother        Liver   Liver cancer Maternal  Grandmother        primary site Colon ., mets to liver    Colon cancer Maternal Grandmother    Breast cancer Paternal Grandmother        Age unknown   Cancer Daughter        kidney   Kidney cancer Daughter    Colon polyps Brother    Colon cancer Maternal Aunt    Cancer Mother    Heart disease Mother    High Cholesterol Mother    Esophageal cancer Neg Hx    Rectal cancer Neg Hx    Stomach cancer Neg Hx     Social History   Socioeconomic History   Marital status: Divorced    Spouse name: Not on file   Number of children: Not on file   Years of education: Not on file   Highest education level: Not on file  Occupational History   Not on file  Tobacco Use   Smoking status: Never   Smokeless tobacco: Never  Vaping Use   Vaping status: Never Used  Substance and Sexual Activity   Alcohol use: Yes    Alcohol/week: 0.0 standard drinks of alcohol    Comment: rare   Drug use: No   Sexual activity: Not Currently    Birth control/protection: Surgical    Comment: HYST-1st intercourse 53 yo-Fewer than 5 partners  Other Topics Concern   Not on file  Social History Narrative   Not on file   Social Drivers of Health   Financial Resource Strain: Not on file  Food Insecurity: No Food Insecurity (12/29/2020)   Received from [facility], [facility]   Hunger Vital Sign    Worried About Running Out of Food in the Last Year: Never true    Ran Out of Food in the Last Year: Never true  Transportation Needs: Not on file  Physical Activity: Not on file  Stress: Not on file  Social Connections: Unknown (12/28/2021)   Received from [facility], [facility]   Social Network    Social Network: Not on file  Intimate Partner Violence: Unknown (11/19/2021)   [facility], [facility]   HITS    Physically Hurt: Not on file    Insult or Talk Down To: Not on file    Threaten Physical Harm: Not on file    Scream or Curse: Not on file    Review of Systems: All other  review of systems negative except as mentioned in the HPI.  Physical Exam: Vital signs BP 122/76   Pulse 64   Temp 97.9 F (36.6 C)   Ht 5\' 9"  (1.753 m)   Wt 135 lb (61.2 kg)   LMP 10/06/2011 (  Exact Date)   SpO2 100%   BMI 19.94 kg/m   General:   Alert,  Well-developed, pleasant and cooperative in NAD Lungs:  Clear throughout to auscultation.   Heart:  Regular rate and rhythm Abdomen:  Soft, nontender and nondistended.   Neuro/Psych:  Alert and cooperative. Normal mood and affect. A and O x 3  Christi Coward, MD [facility]

## 2023-12-16 NOTE — Patient Instructions (Signed)
 Resume previous diet Continue present medications Repeat colonoscopy in 10 years for screening purposes See handout for diverticulosis YOU HAD AN ENDOSCOPIC PROCEDURE TODAY AT THE [facility]:   Refer to the procedure report that was given to you for any specific questions about what was found during the examination.  If the procedure report does not answer your questions, please call your gastroenterologist to clarify.  If you requested that your care partner not be given the details of your procedure findings, then the procedure report has been included in a sealed envelope for you to review at your convenience later.  YOU SHOULD EXPECT: Some feelings of bloating in the abdomen. Passage of more gas than usual.  Walking can help get rid of the air that was put into your GI tract during the procedure and reduce the bloating. If you had a lower endoscopy (such as a colonoscopy or flexible sigmoidoscopy) you may notice spotting of blood in your stool or on the toilet paper. If you underwent a bowel prep for your procedure, you may not have a normal bowel movement for a few days.  Please Note:  You might notice some irritation and congestion in your nose or some drainage.  This is from the oxygen used during your procedure.  There is no need for concern and it should clear up in a day or so.  SYMPTOMS TO REPORT IMMEDIATELY:  Following lower endoscopy (colonoscopy or flexible sigmoidoscopy):  Excessive amounts of blood in the stool  Significant tenderness or worsening of abdominal pains  Swelling of the abdomen that is new, acute  Fever of 100F or higher  For urgent or emergent issues, a gastroenterologist can be reached at any hour by calling [phone]. Do not use MyChart messaging for urgent concerns.   DIET:  We do recommend a small meal at first, but then you may proceed to your regular diet.  Drink plenty of fluids but you should avoid alcoholic beverages for 24  hours.  ACTIVITY:  You should plan to take it easy for the rest of today and you should NOT DRIVE or use heavy machinery until tomorrow (because of the sedation medicines used during the test).    FOLLOW UP: Our staff will call the number listed on your records the next business day following your procedure.  We will call around 7:15- 8:00 am to check on you and address any questions or concerns that you may have regarding the information given to you following your procedure. If we do not reach you, we will leave a message.     If any biopsies were taken you will be contacted by phone or by letter within the next 1-3 weeks.  Please call us  at [phone] if you have not heard about the biopsies in 3 weeks.   SIGNATURES/CONFIDENTIALITY: You and/or your care partner have signed paperwork which will be entered into your electronic medical record.  These signatures attest to the fact that that the information above on your After Visit Summary has been reviewed and is understood.  Full responsibility of the confidentiality of this discharge information lies with you and/or your care-partner.

## 2023-12-16 NOTE — Op Note (Signed)
  [facility] Patient Name: Becky Aguilar Procedure Date: 12/16/2023 7:31 AM MRN: [id number] Endoscopist: Landon Pinion P. General Kenner , MD, [id number] Age: 53 Referring MD:  Date of Birth: 11/19/1970 Gender: Female Account #: [id number] Procedure:                Colonoscopy Indications:              Screening for colon cancer: Family history of                            colorectal cancer in multiple 2nd degree relatives                            (aunt / grandmother), and brother with colon                            polyps. First time exam Medicines:                Monitored Anesthesia Care Procedure:                Pre-Anesthesia Assessment:                           - Prior to the procedure, a History and Physical                            was performed, and patient medications and                            allergies were reviewed. The patient's tolerance of                            previous anesthesia was also reviewed. The risks                            and benefits of the procedure and the sedation                            options and risks were discussed with the patient.                            All questions were answered, and informed consent                            was obtained. Prior Anticoagulants: The patient has                            taken no anticoagulant or antiplatelet agents. ASA                            Grade Assessment: II - A patient with mild systemic                            disease. After reviewing the risks and benefits,  the patient was deemed in satisfactory condition to                            undergo the procedure.                           After obtaining informed consent, the colonoscope                            was passed under direct vision. Throughout the                            procedure, the patient's blood pressure, pulse, and                            oxygen saturations were monitored  continuously. The                            Olympus Scope [id number] was introduced through the                            anus and advanced to the the cecum, identified by                            appendiceal orifice and ileocecal valve. The                            colonoscopy was performed without difficulty. The                            patient tolerated the procedure well. The quality                            of the bowel preparation was adequate. The                            ileocecal valve, appendiceal orifice, and rectum                            were photographed. Scope In: 9:10:43 AM Scope Out: 9:28:29 AM Scope Withdrawal Time: 0 hours 13 minutes 34 seconds  Total Procedure Duration: 0 hours 17 minutes 46 seconds  Findings:                 The perianal and digital rectal examinations were                            normal.                           A few small-mouthed diverticula were found in the                            sigmoid colon.  Anal papilla(e) were hypertrophied.                           The exam was otherwise normal throughout the                            examined colon. Complications:            No immediate complications. Estimated blood loss:                            None. Estimated Blood Loss:     Estimated blood loss: none. Impression:               - Diverticulosis in the sigmoid colon.                           - Anal papilla(e) were hypertrophied.                           - Normal exam otherwise - no polyps. Recommendation:           - Patient has a contact number available for                            emergencies. The signs and symptoms of potential                            delayed complications were discussed with the                            patient. Return to normal activities tomorrow.                            Written discharge instructions were provided to the                            patient.                            - Resume previous diet.                           - Continue present medications.                           - Repeat colonoscopy in 10 years for screening                            purposes unless brother had a high risk polyp, will                            discuss with the patient. Landon Pinion P. Jaryiah Mehlman, MD 12/16/2023 9:33:10 AM This report has been signed electronically.

## 2023-12-16 NOTE — Progress Notes (Signed)
 Pt's states no medical or surgical changes since previsit or office visit.

## 2023-12-16 NOTE — Progress Notes (Signed)
 To pacu, VSS. Report to [email or web address].tb

## 2023-12-19 ENCOUNTER — Telehealth: Payer: Self-pay

## 2023-12-19 NOTE — Telephone Encounter (Signed)
 Follow up call to pt, no answer.

## 2024-01-16 ENCOUNTER — Other Ambulatory Visit: Payer: Self-pay | Admitting: Family Medicine

## 2024-02-23 ENCOUNTER — Other Ambulatory Visit: Payer: Self-pay | Admitting: General Surgery

## 2024-02-23 DIAGNOSIS — Z803 Family history of malignant neoplasm of breast: Secondary | ICD-10-CM

## 2024-02-24 ENCOUNTER — Encounter: Payer: Self-pay | Admitting: General Surgery

## 2024-03-14 ENCOUNTER — Other Ambulatory Visit

## 2024-04-25 ENCOUNTER — Other Ambulatory Visit: Payer: Self-pay | Admitting: *Deleted

## 2024-04-25 MED ORDER — COVID-19 MRNA VAC-TRIS(PFIZER) 30 MCG/0.3ML IM SUSY: 0.3000 mL | PREFILLED_SYRINGE | Freq: Once | INTRAMUSCULAR | 0 refills | Status: AC

## 2024-05-24 ENCOUNTER — Encounter: Payer: Self-pay | Admitting: Family Medicine

## 2024-06-09 ENCOUNTER — Other Ambulatory Visit: Payer: Self-pay | Admitting: Family Medicine

## 2024-08-02 ENCOUNTER — Encounter: Payer: Managed Care, Other (non HMO) | Admitting: Family Medicine

## 2024-09-17 ENCOUNTER — Encounter: Payer: Self-pay | Admitting: Family Medicine

## 2024-09-26 ENCOUNTER — Encounter: Payer: Self-pay | Admitting: Family Medicine
